# Patient Record
Sex: Male | Born: 1945 | Race: White | Hispanic: No | Marital: Married | Smoking: Never smoker
Health system: Southern US, Community
[De-identification: ages and names within clinical notes are randomized; demographics above are authoritative.]

## PROBLEM LIST (undated history)

## (undated) DIAGNOSIS — I517 Cardiomegaly: Secondary | ICD-10-CM

## (undated) DIAGNOSIS — K4090 Unilateral inguinal hernia, without obstruction or gangrene, not specified as recurrent: Secondary | ICD-10-CM

## (undated) DIAGNOSIS — N182 Chronic kidney disease, stage 2 (mild): Secondary | ICD-10-CM

## (undated) DIAGNOSIS — N2889 Other specified disorders of kidney and ureter: Secondary | ICD-10-CM

## (undated) DIAGNOSIS — K219 Gastro-esophageal reflux disease without esophagitis: Secondary | ICD-10-CM

## (undated) DIAGNOSIS — R251 Tremor, unspecified: Secondary | ICD-10-CM

## (undated) DIAGNOSIS — R002 Palpitations: Secondary | ICD-10-CM

## (undated) DIAGNOSIS — I358 Other nonrheumatic aortic valve disorders: Secondary | ICD-10-CM

## (undated) DIAGNOSIS — I499 Cardiac arrhythmia, unspecified: Secondary | ICD-10-CM

## (undated) DIAGNOSIS — M1990 Unspecified osteoarthritis, unspecified site: Secondary | ICD-10-CM

## (undated) DIAGNOSIS — I4891 Unspecified atrial fibrillation: Secondary | ICD-10-CM

## (undated) DIAGNOSIS — R931 Abnormal findings on diagnostic imaging of heart and coronary circulation: Secondary | ICD-10-CM

## (undated) DIAGNOSIS — I10 Essential (primary) hypertension: Secondary | ICD-10-CM

## (undated) DIAGNOSIS — Z8679 Personal history of other diseases of the circulatory system: Secondary | ICD-10-CM

## (undated) DIAGNOSIS — C4490 Unspecified malignant neoplasm of skin, unspecified: Secondary | ICD-10-CM

## (undated) DIAGNOSIS — R011 Cardiac murmur, unspecified: Secondary | ICD-10-CM

## (undated) DIAGNOSIS — I714 Abdominal aortic aneurysm, without rupture, unspecified: Secondary | ICD-10-CM

## (undated) DIAGNOSIS — E785 Hyperlipidemia, unspecified: Secondary | ICD-10-CM

## (undated) DIAGNOSIS — I739 Peripheral vascular disease, unspecified: Secondary | ICD-10-CM

## (undated) DIAGNOSIS — C801 Malignant (primary) neoplasm, unspecified: Secondary | ICD-10-CM

## (undated) DIAGNOSIS — N289 Disorder of kidney and ureter, unspecified: Secondary | ICD-10-CM

## (undated) DIAGNOSIS — Z87442 Personal history of urinary calculi: Secondary | ICD-10-CM

## (undated) DIAGNOSIS — D751 Secondary polycythemia: Secondary | ICD-10-CM

## (undated) DIAGNOSIS — G4733 Obstructive sleep apnea (adult) (pediatric): Secondary | ICD-10-CM

## (undated) DIAGNOSIS — I341 Nonrheumatic mitral (valve) prolapse: Secondary | ICD-10-CM

## (undated) DIAGNOSIS — I340 Nonrheumatic mitral (valve) insufficiency: Secondary | ICD-10-CM

## (undated) HISTORY — DX: Hyperlipidemia, unspecified: E78.5

## (undated) HISTORY — DX: Palpitations: R00.2

## (undated) HISTORY — PX: LUMBAR FUSION: SHX111

## (undated) HISTORY — DX: Other specified disorders of kidney and ureter: N28.89

## (undated) HISTORY — DX: Personal history of other diseases of the circulatory system: Z86.79

## (undated) HISTORY — DX: Cardiomegaly: I51.7

## (undated) HISTORY — DX: Unspecified malignant neoplasm of skin, unspecified: C44.90

## (undated) HISTORY — DX: Other nonrheumatic aortic valve disorders: I35.8

## (undated) HISTORY — PX: KNEE ARTHROSCOPY: SUR90

## (undated) HISTORY — DX: Chronic kidney disease, stage 2 (mild): N18.2

## (undated) HISTORY — DX: Nonrheumatic mitral (valve) insufficiency: I34.0

## (undated) HISTORY — DX: Cardiac arrhythmia, unspecified: I49.9

## (undated) HISTORY — DX: Peripheral vascular disease, unspecified: I73.9

## (undated) HISTORY — DX: Nonrheumatic mitral (valve) prolapse: I34.1

## (undated) HISTORY — DX: Abnormal findings on diagnostic imaging of heart and coronary circulation: R93.1

## (undated) HISTORY — DX: Unilateral inguinal hernia, without obstruction or gangrene, not specified as recurrent: K40.90

## (undated) HISTORY — DX: Abdominal aortic aneurysm, without rupture, unspecified: I71.40

## (undated) HISTORY — DX: Secondary polycythemia: D75.1

## (undated) HISTORY — DX: Tremor, unspecified: R25.1

## (undated) HISTORY — DX: Unspecified atrial fibrillation: I48.91

## (undated) HISTORY — PX: HERNIA REPAIR: SHX51

## (undated) HISTORY — PX: SHOULDER ARTHROSCOPY: SHX128

## (undated) HISTORY — PX: OTHER SURGICAL HISTORY: SHX169

## (undated) HISTORY — DX: Cardiac murmur, unspecified: R01.1

## (undated) HISTORY — DX: Obstructive sleep apnea (adult) (pediatric): G47.33

---

## 2016-08-24 DIAGNOSIS — M7670 Peroneal tendinitis, unspecified leg: Secondary | ICD-10-CM | POA: Insufficient documentation

## 2016-09-14 DIAGNOSIS — M79673 Pain in unspecified foot: Secondary | ICD-10-CM | POA: Insufficient documentation

## 2017-08-20 DIAGNOSIS — N182 Chronic kidney disease, stage 2 (mild): Secondary | ICD-10-CM | POA: Insufficient documentation

## 2017-08-20 DIAGNOSIS — K219 Gastro-esophageal reflux disease without esophagitis: Secondary | ICD-10-CM | POA: Insufficient documentation

## 2017-09-10 DIAGNOSIS — E7800 Pure hypercholesterolemia, unspecified: Secondary | ICD-10-CM | POA: Insufficient documentation

## 2017-09-10 DIAGNOSIS — D751 Secondary polycythemia: Secondary | ICD-10-CM | POA: Insufficient documentation

## 2017-09-10 DIAGNOSIS — H04123 Dry eye syndrome of bilateral lacrimal glands: Secondary | ICD-10-CM | POA: Insufficient documentation

## 2017-09-10 DIAGNOSIS — J309 Allergic rhinitis, unspecified: Secondary | ICD-10-CM | POA: Insufficient documentation

## 2019-09-11 ENCOUNTER — Other Ambulatory Visit: Payer: Self-pay

## 2019-09-11 DIAGNOSIS — Z20822 Contact with and (suspected) exposure to covid-19: Secondary | ICD-10-CM

## 2019-09-13 LAB — NOVEL CORONAVIRUS, NAA: SARS-CoV-2, NAA: NOT DETECTED

## 2019-10-02 DIAGNOSIS — M19019 Primary osteoarthritis, unspecified shoulder: Secondary | ICD-10-CM | POA: Insufficient documentation

## 2021-07-24 ENCOUNTER — Other Ambulatory Visit (HOSPITAL_BASED_OUTPATIENT_CLINIC_OR_DEPARTMENT_OTHER): Payer: Self-pay | Admitting: Family Medicine

## 2021-07-24 DIAGNOSIS — R109 Unspecified abdominal pain: Secondary | ICD-10-CM

## 2021-07-29 ENCOUNTER — Other Ambulatory Visit: Payer: Self-pay

## 2021-07-29 ENCOUNTER — Ambulatory Visit (HOSPITAL_BASED_OUTPATIENT_CLINIC_OR_DEPARTMENT_OTHER)
Admission: RE | Admit: 2021-07-29 | Discharge: 2021-07-29 | Disposition: A | Discharge status: Home / Self Care | Payer: Medicare Other | Source: Ambulatory Visit | Attending: Family Medicine | Admitting: Family Medicine

## 2021-07-29 DIAGNOSIS — R10A0 Flank pain, unspecified side: Secondary | ICD-10-CM

## 2021-07-29 DIAGNOSIS — R109 Unspecified abdominal pain: Secondary | ICD-10-CM | POA: Diagnosis present

## 2021-07-29 LAB — POCT I-STAT CREATININE: Creatinine, Ser: 1 mg/dL (ref 0.61–1.24)

## 2021-07-29 MED ORDER — IOHEXOL 300 MG/ML  SOLN
75.0000 mL | Freq: Once | INTRAMUSCULAR | Status: AC | PRN
  Administered 2021-07-29: 75 mL via INTRAVENOUS

## 2021-08-05 ENCOUNTER — Other Ambulatory Visit: Payer: Self-pay

## 2021-08-05 ENCOUNTER — Encounter (HOSPITAL_COMMUNITY): Payer: Self-pay | Admitting: Pharmacy Technician

## 2021-08-05 ENCOUNTER — Emergency Department (HOSPITAL_COMMUNITY): Payer: Medicare Other

## 2021-08-05 ENCOUNTER — Emergency Department (HOSPITAL_COMMUNITY)
Admission: EM | Admit: 2021-08-05 | Discharge: 2021-08-06 | Disposition: A | Discharge status: Home / Self Care | Payer: Medicare Other | Attending: Emergency Medicine | Admitting: Emergency Medicine

## 2021-08-05 ENCOUNTER — Ambulatory Visit (HOSPITAL_BASED_OUTPATIENT_CLINIC_OR_DEPARTMENT_OTHER): Payer: Medicare Other

## 2021-08-05 DIAGNOSIS — I10 Essential (primary) hypertension: Secondary | ICD-10-CM | POA: Diagnosis not present

## 2021-08-05 DIAGNOSIS — I639 Cerebral infarction, unspecified: Secondary | ICD-10-CM | POA: Diagnosis not present

## 2021-08-05 DIAGNOSIS — R29810 Facial weakness: Secondary | ICD-10-CM | POA: Insufficient documentation

## 2021-08-05 DIAGNOSIS — H538 Other visual disturbances: Secondary | ICD-10-CM

## 2021-08-05 DIAGNOSIS — Z859 Personal history of malignant neoplasm, unspecified: Secondary | ICD-10-CM | POA: Diagnosis not present

## 2021-08-05 DIAGNOSIS — H53141 Visual discomfort, right eye: Secondary | ICD-10-CM | POA: Diagnosis not present

## 2021-08-05 HISTORY — DX: Essential (primary) hypertension: I10

## 2021-08-05 HISTORY — DX: Malignant (primary) neoplasm, unspecified: C80.1

## 2021-08-05 HISTORY — DX: Disorder of kidney and ureter, unspecified: N28.9

## 2021-08-05 LAB — COMPREHENSIVE METABOLIC PANEL
ALT: 28 U/L (ref 0–44)
AST: 32 U/L (ref 15–41)
Albumin: 3.9 g/dL (ref 3.5–5.0)
Alkaline Phosphatase: 57 U/L (ref 38–126)
Anion gap: 10 (ref 5–15)
BUN: 20 mg/dL (ref 8–23)
CO2: 26 mmol/L (ref 22–32)
Calcium: 9.2 mg/dL (ref 8.9–10.3)
Chloride: 103 mmol/L (ref 98–111)
Creatinine, Ser: 0.99 mg/dL (ref 0.61–1.24)
GFR, Estimated: >60 mL/min (ref >60)
Glucose, Bld: 102 mg/dL — ABNORMAL HIGH (ref 70–99)
Potassium: 4 mmol/L (ref 3.5–5.1)
Sodium: 139 mmol/L (ref 135–145)
Total Bilirubin: 0.8 mg/dL (ref 0.3–1.2)
Total Protein: 6.5 g/dL (ref 6.5–8.1)

## 2021-08-05 LAB — URINALYSIS, ROUTINE W REFLEX MICROSCOPIC
Bacteria, UA: NONE SEEN
Bilirubin Urine: NEGATIVE
Glucose, UA: NEGATIVE mg/dL
Ketones, ur: NEGATIVE mg/dL
Leukocytes,Ua: NEGATIVE
Nitrite: NEGATIVE
Protein, ur: 100 mg/dL — AB
Specific Gravity, Urine: 1.019 (ref 1.005–1.030)
pH: 5 (ref 5.0–8.0)

## 2021-08-05 LAB — DIFFERENTIAL
Abs Immature Granulocytes: 0.02 10*3/uL (ref 0.00–0.07)
Basophils Absolute: 0.1 10*3/uL (ref 0.0–0.1)
Basophils Relative: 1 %
Eosinophils Absolute: 0.2 10*3/uL (ref 0.0–0.5)
Eosinophils Relative: 3 %
Immature Granulocytes: 0 %
Lymphocytes Relative: 32 %
Lymphs Abs: 2.6 10*3/uL (ref 0.7–4.0)
Monocytes Absolute: 0.7 10*3/uL (ref 0.1–1.0)
Monocytes Relative: 9 %
Neutro Abs: 4.4 10*3/uL (ref 1.7–7.7)
Neutrophils Relative %: 55 %

## 2021-08-05 LAB — PROTIME-INR
INR: 1.1 (ref 0.8–1.2)
Prothrombin Time: 14.2 seconds (ref 11.4–15.2)

## 2021-08-05 LAB — APTT: aPTT: 29 seconds (ref 24–36)

## 2021-08-05 LAB — CBC
HCT: 50.4 % (ref 39.0–52.0)
Hemoglobin: 16.6 g/dL (ref 13.0–17.0)
MCH: 31.2 pg (ref 26.0–34.0)
MCHC: 32.9 g/dL (ref 30.0–36.0)
MCV: 94.7 fL (ref 80.0–100.0)
Platelets: 187 10*3/uL (ref 150–400)
RBC: 5.32 MIL/uL (ref 4.22–5.81)
RDW: 12.3 % (ref 11.5–15.5)
WBC: 8 10*3/uL (ref 4.0–10.5)
nRBC: 0 % (ref 0.0–0.2)

## 2021-08-05 LAB — SEDIMENTATION RATE: Sed Rate: 1 mm/hr (ref 0–16)

## 2021-08-05 LAB — C-REACTIVE PROTEIN: CRP: 0.7 mg/dL (ref <1.0)

## 2021-08-05 NOTE — ED Triage Notes (Signed)
Pt here from eye doctor for neuro workup. Per pt, yesterday his R vision went out for 30 seconds and returned. Pt states similar episode earlier this week. No weakness noted. Pt with slight L facial droop.
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
## 2021-08-05 NOTE — ED Provider Notes (Addendum)
Emergency Medicine Provider Triage Evaluation Note  Gavin Hernandez , a 75 y.o. male  was evaluated in triage.  Pt complains of intermittent visual loss which has occurred twice over the last few days. Sxs are resolved currently. He was seen at Dr. Darleen Crocker office PTA and had a w/u which was unrevealing for papilledema. He was sent to the ED for r/o GCA and stroke. Recommended ESR, CBC, carotid doppler and echo.  Recently dx with probable renal cell carcinoma last week  Review of Systems  Positive: Vision loss Negative: Numbness, weakness, aphasia  Physical Exam  BP (!) 169/91 (BP Location: Left Arm)   Pulse 61   Temp 98.2 F (36.8 C) (Oral)   Resp 18   SpO2 99%  Gen:   Awake, no distress   Resp:  Normal effort  MSK:   Moves extremities without difficulty  Other:  Possible left facial droop? No pronator drift, normal finger to nose bilat. No aphasia or slurred speech. No obvious focal deficit  Medical Decision Making  Medically screening exam initiated at 4:31 PM.  Appropriate orders placed.  Gavin Hernandez was informed that the remainder of the evaluation will be completed by another provider, this initial triage assessment does not replace that evaluation, and the importance of remaining in the ED until their evaluation is complete.     Rodney Booze, PA-C 08/05/21 1633    Rodney Booze, PA-C 08/05/21 1640    Margette Fast, MD 08/05/21 2214
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
## 2021-08-05 NOTE — ED Notes (Signed)
Patient transported to CT 

## 2021-08-06 ENCOUNTER — Emergency Department (HOSPITAL_COMMUNITY): Payer: Medicare Other

## 2021-08-06 DIAGNOSIS — H53141 Visual discomfort, right eye: Secondary | ICD-10-CM | POA: Diagnosis not present

## 2021-08-06 MED ORDER — IOHEXOL 350 MG/ML SOLN
75.0000 mL | Freq: Once | INTRAVENOUS | Status: AC | PRN
  Administered 2021-08-06: 75 mL via INTRAVENOUS

## 2021-08-06 NOTE — ED Notes (Signed)
Patient verbalizes understanding of discharge instructions. Opportunity for questioning and answers were provided. Armband removed by staff, pt discharged from ED via wheelchair.  

## 2021-08-13 ENCOUNTER — Other Ambulatory Visit: Payer: Self-pay | Admitting: Urology

## 2021-08-13 DIAGNOSIS — D49511 Neoplasm of unspecified behavior of right kidney: Secondary | ICD-10-CM

## 2021-08-14 ENCOUNTER — Other Ambulatory Visit (HOSPITAL_COMMUNITY): Payer: Self-pay | Admitting: Urology

## 2021-08-14 ENCOUNTER — Other Ambulatory Visit: Payer: Self-pay

## 2021-08-14 ENCOUNTER — Ambulatory Visit (HOSPITAL_COMMUNITY)
Admission: RE | Admit: 2021-08-14 | Discharge: 2021-08-14 | Disposition: A | Discharge status: Home / Self Care | Payer: Medicare Other | Source: Ambulatory Visit | Attending: Urology | Admitting: Urology

## 2021-08-14 DIAGNOSIS — D49511 Neoplasm of unspecified behavior of right kidney: Secondary | ICD-10-CM | POA: Diagnosis present

## 2021-08-14 DIAGNOSIS — I82220 Acute embolism and thrombosis of inferior vena cava: Secondary | ICD-10-CM | POA: Insufficient documentation

## 2021-08-21 ENCOUNTER — Other Ambulatory Visit: Payer: Self-pay | Admitting: Urology

## 2021-08-22 NOTE — Progress Notes (Signed)
Sent message, via epic in basket, requesting orders in epic from surgeon.  

## 2021-08-25 ENCOUNTER — Other Ambulatory Visit (HOSPITAL_COMMUNITY): Payer: Self-pay

## 2021-08-26 ENCOUNTER — Other Ambulatory Visit: Payer: Self-pay | Admitting: Urology

## 2021-08-26 NOTE — Patient Instructions (Addendum)
DUE TO COVID-19 ONLY ONE VISITOR IS ALLOWED TO COME WITH YOU AND STAY IN THE WAITING ROOM ONLY DURING PRE OP AND PROCEDURE.   **NO VISITORS ARE ALLOWED IN THE SHORT STAY AREA OR RECOVERY ROOM!!**  IF YOU WILL BE ADMITTED INTO THE HOSPITAL YOU ARE ALLOWED ONLY TWO SUPPORT PEOPLE DURING VISITATION HOURS ONLY (10AM -8PM)   The support person(s) may change daily. The support person(s) must pass our screening, gel in and out, and wear a mask at all times, including in the patient's room. Patients must also wear a mask when staff or their support person are in the room.  No visitors under the age of 16. Any visitor under the age of 69 must be accompanied by an adult.        Your procedure is scheduled on: 08/29/21   Report to [facility] Main Entrance    Report to admitting at 10:15 AM   Call this number if you have problems the morning of surgery [phone]   Do not eat food :After Midnight.   May have liquids until 9:30 AM day of surgery  CLEAR LIQUID DIET  Foods Allowed                                                                     Foods Excluded  Water, Black Coffee and tea (no milk or creamer)            liquids that you cannot  Plain Jell-O in any flavor  (No red)                                    see through such as: Fruit ices (not with fruit pulp)                                            milk, soups, orange juice              Iced Popsicles (No red)                                                All solid food                                   Apple juices Sports drinks like Gatorade (No red) Lightly seasoned clear broth or consume(fat free) Sugar     Oral Hygiene is also important to reduce your risk of infection.                                    Remember - BRUSH YOUR TEETH THE MORNING OF SURGERY WITH YOUR REGULAR TOOTHPASTE   Take these medicines the morning of surgery with A SIP OF WATER: Omeprazole  DO NOT TAKE ANY ORAL DIABETIC MEDICATIONS DAY OF  YOUR SURGERY                              You may not have any metal on your body including jewelry, and body piercing             Do not wear lotions, powders, cologne, or deodorant              Men may shave face and neck.   Do not bring valuables to the hospital. [place].   Bring small overnight bag day of surgery.  Special Instructions: Bring a copy of your healthcare power of attorney and living will documents         the day of surgery if you haven't scanned them before.  Please read over the following fact sheets you were given: IF YOU HAVE QUESTIONS ABOUT YOUR PRE-OP INSTRUCTIONS PLEASE CALL [facility] - Preparing for Surgery Before surgery, you can play an important role.  Because skin is not sterile, your skin needs to be as free of germs as possible.  You can reduce the number of germs on your skin by washing with CHG (chlorahexidine gluconate) soap before surgery.  CHG is an antiseptic cleaner which kills germs and bonds with the skin to continue killing germs even after washing. Please DO NOT use if you have an allergy to CHG or antibacterial soaps.  If your skin becomes reddened/irritated stop using the CHG and inform your nurse when you arrive at Short Stay. Do not shave (including legs and underarms) for at least 48 hours prior to the first CHG shower.  You may shave your face/neck.  Please follow these instructions carefully:  1.  Shower with CHG Soap the night before surgery and the  morning of surgery.  2.  If you choose to wash your hair, wash your hair first as usual with your normal  shampoo.  3.  After you shampoo, rinse your hair and body thoroughly to remove the shampoo.                             4.  Use CHG as you would any other liquid soap.  You can apply chg directly to the skin and wash.  Gently with a scrungie or clean washcloth.  5.  Apply the CHG Soap to your body ONLY FROM THE  NECK DOWN.   Do   not use on face/ open                           Wound or open sores. Avoid contact with eyes, ears mouth and   genitals (private parts).                       Wash face,  Genitals (private parts) with your normal soap.             6.  Wash thoroughly, paying special attention to the area where your    surgery  will be performed.  7.  Thoroughly rinse your body with warm water from the neck down.  8.  DO NOT shower/wash with your normal soap after using and rinsing off the CHG Soap.  9.  Pat yourself dry with a clean towel.            10.  Wear clean pajamas.            11.  Place clean sheets on your bed the night of your first shower and do not  sleep with pets. Day of Surgery : Do not apply any lotions/deodorants the morning of surgery.  Please wear clean clothes to the hospital/surgery center.  FAILURE TO FOLLOW THESE INSTRUCTIONS MAY RESULT IN THE CANCELLATION OF YOUR SURGERY  PATIENT SIGNATURE_________________________________  NURSE SIGNATURE__________________________________  ________________________________________________________________________   Gavin Hernandez  An incentive spirometer is a tool that can help keep your lungs clear and active. This tool measures how well you are filling your lungs with each breath. Taking long deep breaths may help reverse or decrease the chance of developing breathing (pulmonary) problems (especially infection) following: A long period of time when you are unable to move or be active. BEFORE THE PROCEDURE  If the spirometer includes an indicator to show your best effort, your nurse or respiratory therapist will set it to a desired goal. If possible, sit up straight or lean slightly forward. Try not to slouch. Hold the incentive spirometer in an upright position. INSTRUCTIONS FOR USE  Sit on the edge of your bed if possible, or sit up as far as you can in bed or on a chair. Hold the incentive spirometer in  an upright position. Breathe out normally. Place the mouthpiece in your mouth and seal your lips tightly around it. Breathe in slowly and as deeply as possible, raising the piston or the ball toward the top of the column. Hold your breath for 3-5 seconds or for as long as possible. Allow the piston or ball to fall to the bottom of the column. Remove the mouthpiece from your mouth and breathe out normally. Rest for a few seconds and repeat Steps 1 through 7 at least 10 times every 1-2 hours when you are awake. Take your time and take a few normal breaths between deep breaths. The spirometer may include an indicator to show your best effort. Use the indicator as a goal to work toward during each repetition. After each set of 10 deep breaths, practice coughing to be sure your lungs are clear. If you have an incision (the cut made at the time of surgery), support your incision when coughing by placing a pillow or rolled up towels firmly against it. Once you are able to get out of bed, walk around indoors and cough well. You may stop using the incentive spirometer when instructed by your caregiver.  RISKS AND COMPLICATIONS Take your time so you do not get dizzy or light-headed. If you are in pain, you may need to take or ask for pain medication before doing incentive spirometry. It is harder to take a deep breath if you are having pain. AFTER USE Rest and breathe slowly and easily. It can be helpful to keep track of a log of your progress. Your caregiver can provide you with a simple table to help with this. If you are using the spirometer at home, follow these instructions: [place] IF:  You are having difficultly using the spirometer. You have trouble using the spirometer as often as instructed. Your pain medication is not giving enough relief while using the spirometer. You develop fever of 100.5 F (38.1 C) or higher. SEEK IMMEDIATE MEDICAL CARE IF:  You cough up bloody sputum that  had not been present before. You develop fever of 102 F (38.9 C) or greater. You develop worsening pain at or near the incision site. MAKE SURE YOU:  Understand these instructions. Will watch your condition. Will get help right away if you are not doing well or get worse. Document Released: 02/22/2007 Document Revised: 01/04/2012 Document Reviewed: 04/25/2007 Exit[employer] Patient Information 2014 [employer], [place].   ________________________________________________________________________    WHAT IS A BLOOD TRANSFUSION? Blood Transfusion Information  A transfusion is the replacement of blood or some of its parts. Blood is made up of multiple cells which provide different functions. Red blood cells carry oxygen and are used for blood loss replacement. White blood cells fight against infection. Platelets control bleeding. Plasma helps clot blood. Other blood products are available for specialized needs, such as hemophilia or other clotting disorders. BEFORE THE TRANSFUSION  Who gives blood for transfusions?  Healthy volunteers who are fully evaluated to make sure their blood is safe. This is blood bank blood. Transfusion therapy is the safest it has ever been in the practice of medicine. Before blood is taken from a donor, a complete history is taken to make sure that person has no history of diseases nor engages in risky social behavior (examples are intravenous drug use or sexual activity with multiple partners). The donor's travel history is screened to minimize risk of transmitting infections, such as malaria. The donated blood is tested for signs of infectious diseases, such as HIV and hepatitis. The blood is then tested to be sure it is compatible with you in order to minimize the chance of a transfusion reaction. If you or a relative donates blood, this is often done in anticipation of surgery and is not appropriate for emergency situations. It takes many days to process the donated  blood. RISKS AND COMPLICATIONS Although transfusion therapy is very safe and saves many lives, the main dangers of transfusion include:  Getting an infectious disease. Developing a transfusion reaction. This is an allergic reaction to something in the blood you were given. Every precaution is taken to prevent this. The decision to have a blood transfusion has been considered carefully by your caregiver before blood is given. Blood is not given unless the benefits outweigh the risks. AFTER THE TRANSFUSION Right after receiving a blood transfusion, you will usually feel much better and more energetic. This is especially true if your red blood cells have gotten low (anemic). The transfusion raises the level of the red blood cells which carry oxygen, and this usually causes an energy increase. The nurse administering the transfusion will monitor you carefully for complications. HOME CARE INSTRUCTIONS  No special instructions are needed after a transfusion. You may find your energy is better. Speak with your caregiver about any limitations on activity for underlying diseases you may have. SEEK MEDICAL CARE IF:  Your condition is not improving after your transfusion. You develop redness or irritation at the intravenous (IV) site. SEEK IMMEDIATE MEDICAL CARE IF:  Any of the following symptoms occur over the next 12 hours: Shaking chills. You have a temperature by mouth above 102 F (38.9 C), not controlled by medicine. Chest, back, or muscle pain. People around you feel you are not acting correctly or are confused. Shortness of breath or difficulty breathing. Dizziness and fainting. You get a rash or develop hives. You have a decrease in urine output. Your urine turns a dark color or changes to pink, red, or brown. Any of the following symptoms occur over the next  10 days: You have a temperature by mouth above 102 F (38.9 C), not controlled by medicine. Shortness of breath. Weakness after  normal activity. The white part of the eye turns yellow (jaundice). You have a decrease in the amount of urine or are urinating less often. Your urine turns a dark color or changes to pink, red, or brown. Document Released: 10/09/2000 Document Revised: 01/04/2012 Document Reviewed: 05/28/2008 [facility] Patient Information 2014 [place], [place].  _______________________________________________________________________

## 2021-08-26 NOTE — Progress Notes (Addendum)
COVID swab appointment: 08/26/21  COVID Vaccine Completed: yes x5 Date COVID Vaccine completed: Has received booster: COVID vaccine manufacturer:  Moderna     Date of COVID positive in last 90 days: no  PCP - Sela Hilding, MD Cardiologist - Dr. Dorris Carnes, MD hasn't seen yet  Chest x-ray - 08/14/21 Epic EKG - 08/06/21 Epic Stress Test - many years per pt ECHO - n/a Cardiac Cath - n/a Pacemaker/ICD device last checked: n/a Spinal Cord Stimulator: n/a  Sleep Study - yes positive, had deviated septum fixed CPAP - no longer uses  Fasting Blood Sugar - n/a Checks Blood Sugar _____ times a day  Blood Thinner Instructions: n/a Aspirin Instructions: Last Dose:  Activity level: Can go up a flight of stairs and perform activities of daily living without stopping and without symptoms of chest pain or shortness of breath.   Anesthesia review: HTN, needs clearance?  Patient denies shortness of breath, fever, cough and chest pain at PAT appointment   Patient verbalized understanding of instructions that were given to them at the PAT appointment. Patient was also instructed that they will need to review over the PAT instructions again at home before surgery.

## 2021-08-27 ENCOUNTER — Ambulatory Visit
Admission: RE | Admit: 2021-08-27 | Discharge: 2021-08-27 | Disposition: A | Discharge status: Home / Self Care | Payer: Medicare Other | Source: Ambulatory Visit | Attending: Urology | Admitting: Urology

## 2021-08-27 ENCOUNTER — Encounter (HOSPITAL_COMMUNITY): Payer: Self-pay

## 2021-08-27 ENCOUNTER — Encounter (HOSPITAL_COMMUNITY)
Admission: RE | Admit: 2021-08-27 | Discharge: 2021-08-27 | Disposition: A | Discharge status: Home / Self Care | Payer: Medicare Other | Source: Ambulatory Visit | Attending: Urology | Admitting: Urology

## 2021-08-27 ENCOUNTER — Other Ambulatory Visit: Payer: Self-pay

## 2021-08-27 DIAGNOSIS — Z79899 Other long term (current) drug therapy: Secondary | ICD-10-CM | POA: Insufficient documentation

## 2021-08-27 DIAGNOSIS — Z87442 Personal history of urinary calculi: Secondary | ICD-10-CM | POA: Insufficient documentation

## 2021-08-27 DIAGNOSIS — K219 Gastro-esophageal reflux disease without esophagitis: Secondary | ICD-10-CM | POA: Insufficient documentation

## 2021-08-27 DIAGNOSIS — I10 Essential (primary) hypertension: Secondary | ICD-10-CM | POA: Insufficient documentation

## 2021-08-27 DIAGNOSIS — Z01812 Encounter for preprocedural laboratory examination: Secondary | ICD-10-CM | POA: Insufficient documentation

## 2021-08-27 DIAGNOSIS — D49511 Neoplasm of unspecified behavior of right kidney: Secondary | ICD-10-CM

## 2021-08-27 DIAGNOSIS — N2889 Other specified disorders of kidney and ureter: Secondary | ICD-10-CM | POA: Insufficient documentation

## 2021-08-27 HISTORY — DX: Gastro-esophageal reflux disease without esophagitis: K21.9

## 2021-08-27 HISTORY — DX: Unspecified osteoarthritis, unspecified site: M19.90

## 2021-08-27 HISTORY — DX: Personal history of urinary calculi: Z87.442

## 2021-08-27 LAB — SARS CORONAVIRUS 2 (TAT 6-24 HRS): SARS Coronavirus 2: NEGATIVE

## 2021-08-27 MED ORDER — GADOBENATE DIMEGLUMINE 529 MG/ML IV SOLN
17.0000 mL | Freq: Once | INTRAVENOUS | Status: AC | PRN
  Administered 2021-08-27: 17 mL via INTRAVENOUS

## 2021-08-28 ENCOUNTER — Encounter (HOSPITAL_COMMUNITY): Payer: Self-pay

## 2021-08-28 NOTE — Progress Notes (Signed)
Anesthesia Chart Review   Case: [id number] Date/Time: 08/29/21 1215   Procedure: XI ROBOTIC ASSISTED LAPAROSCOPIC RADICAL NEPHRECTOMY (Right) - ONLY NEEDS 180 MIN   Anesthesia type: General   Pre-op diagnosis: RIGHT RENAL MASS   Location: [facility] ROOM 03 / [facility]   Surgeons: Ceasar Mons, MD       DISCUSSION:75 y.o. never smoker with h/o GERD, HTN, right renal mass scheduled for above procedure 08/29/2021 with Dr. Nadeen Landau.   No cardiac history.  Reports he can go up a flight of stairs without difficulty.    Anticipate pt can proceed with planned procedure barring acute status change.   VS: BP (!) 155/81   Pulse (!) 58   Temp 37.2 C (Oral)   Resp 16   Ht 5\' 9"  (1.753 m)   Wt 81.6 kg   SpO2 98%   BMI 26.58 kg/m   PROVIDERS: Glenis Smoker, MD is PCP    LABS: Labs reviewed: Acceptable for surgery. (all labs ordered are listed, but only abnormal results are displayed)  Labs Reviewed  TYPE AND SCREEN     IMAGES:   EKG: 08/06/2021 Rate 54 bpm  Sinus bradycardia   CV:  Past Medical History:  Diagnosis Date   Arthritis    Cancer ([place])    GERD (gastroesophageal reflux disease)    History of kidney stones    Hypertension    Renal disorder     Past Surgical History:  Procedure Laterality Date   KNEE ARTHROSCOPY Left    x2   LUMBAR FUSION     SHOULDER ARTHROSCOPY     thumb surgery      MEDICATIONS:  atorvastatin (LIPITOR) 20 MG tablet   Coenzyme Q10 (COQ10) 200 MG CAPS   diphenhydramine-acetaminophen (TYLENOL PM) 25-500 MG TABS tablet   famotidine (PEPCID) 40 MG tablet   Glucosamine-Chondroitin (OSTEO BI-FLEX REGULAR STRENGTH PO)   losartan (COZAAR) 25 MG tablet   Multiple Vitamin (MULTIVITAMIN WITH MINERALS) TABS tablet   omeprazole (PRILOSEC) 40 MG capsule   OVER THE COUNTER MEDICATION   RESTASIS 0.05 % ophthalmic emulsion   Saw Palmetto 450 MG CAPS   No current facility-administered medications for this encounter.     Konrad Felix, PA-C [facility] Pre-Surgical Testing [phone]

## 2021-08-29 ENCOUNTER — Other Ambulatory Visit (HOSPITAL_COMMUNITY): Payer: Self-pay

## 2021-08-29 ENCOUNTER — Inpatient Hospital Stay (HOSPITAL_COMMUNITY): Payer: Medicare Other | Admitting: Physician Assistant

## 2021-08-29 ENCOUNTER — Encounter (HOSPITAL_COMMUNITY)
Admission: RE | Disposition: A | Discharge status: Home / Self Care | Payer: Self-pay | Source: Home / Self Care | Attending: Urology

## 2021-08-29 ENCOUNTER — Encounter (HOSPITAL_COMMUNITY): Payer: Self-pay | Admitting: Urology

## 2021-08-29 ENCOUNTER — Inpatient Hospital Stay (HOSPITAL_COMMUNITY)
Admission: RE | Admit: 2021-08-29 | Discharge: 2021-08-30 | DRG: 658 | Disposition: A | Discharge status: Home / Self Care | Payer: Medicare Other | Attending: Urology | Admitting: Urology

## 2021-08-29 ENCOUNTER — Inpatient Hospital Stay (HOSPITAL_COMMUNITY): Payer: Medicare Other | Admitting: Anesthesiology

## 2021-08-29 ENCOUNTER — Other Ambulatory Visit: Payer: Self-pay

## 2021-08-29 DIAGNOSIS — I10 Essential (primary) hypertension: Secondary | ICD-10-CM | POA: Diagnosis present

## 2021-08-29 DIAGNOSIS — Z981 Arthrodesis status: Secondary | ICD-10-CM

## 2021-08-29 DIAGNOSIS — Z79899 Other long term (current) drug therapy: Secondary | ICD-10-CM | POA: Diagnosis not present

## 2021-08-29 DIAGNOSIS — Z20822 Contact with and (suspected) exposure to covid-19: Secondary | ICD-10-CM | POA: Diagnosis present

## 2021-08-29 DIAGNOSIS — N281 Cyst of kidney, acquired: Secondary | ICD-10-CM | POA: Diagnosis present

## 2021-08-29 DIAGNOSIS — Z01812 Encounter for preprocedural laboratory examination: Secondary | ICD-10-CM

## 2021-08-29 DIAGNOSIS — N2889 Other specified disorders of kidney and ureter: Secondary | ICD-10-CM | POA: Diagnosis present

## 2021-08-29 DIAGNOSIS — E739 Lactose intolerance, unspecified: Secondary | ICD-10-CM | POA: Diagnosis present

## 2021-08-29 DIAGNOSIS — M1990 Unspecified osteoarthritis, unspecified site: Secondary | ICD-10-CM | POA: Diagnosis present

## 2021-08-29 DIAGNOSIS — C641 Malignant neoplasm of right kidney, except renal pelvis: Principal | ICD-10-CM | POA: Diagnosis present

## 2021-08-29 DIAGNOSIS — Z881 Allergy status to other antibiotic agents status: Secondary | ICD-10-CM

## 2021-08-29 DIAGNOSIS — N200 Calculus of kidney: Secondary | ICD-10-CM | POA: Diagnosis present

## 2021-08-29 DIAGNOSIS — K219 Gastro-esophageal reflux disease without esophagitis: Secondary | ICD-10-CM | POA: Diagnosis present

## 2021-08-29 DIAGNOSIS — Z87442 Personal history of urinary calculi: Secondary | ICD-10-CM

## 2021-08-29 HISTORY — PX: ROBOT ASSISTED LAPAROSCOPIC NEPHRECTOMY: SHX5140

## 2021-08-29 LAB — ABO/RH: ABO/RH(D): A POS

## 2021-08-29 LAB — BASIC METABOLIC PANEL
Anion gap: 8 (ref 5–15)
BUN: 16 mg/dL (ref 8–23)
CO2: 25 mmol/L (ref 22–32)
Calcium: 9.2 mg/dL (ref 8.9–10.3)
Chloride: 106 mmol/L (ref 98–111)
Creatinine, Ser: 0.88 mg/dL (ref 0.61–1.24)
GFR, Estimated: >60 mL/min (ref >60)
Glucose, Bld: 89 mg/dL (ref 70–99)
Potassium: 3.8 mmol/L (ref 3.5–5.1)
Sodium: 139 mmol/L (ref 135–145)

## 2021-08-29 LAB — TYPE AND SCREEN
ABO/RH(D): A POS
Antibody Screen: NEGATIVE

## 2021-08-29 LAB — HEMOGLOBIN AND HEMATOCRIT, BLOOD
HCT: 46.8 % (ref 39.0–52.0)
Hemoglobin: 15.9 g/dL (ref 13.0–17.0)

## 2021-08-29 LAB — CBC
HCT: 48.4 % (ref 39.0–52.0)
Hemoglobin: 16.6 g/dL (ref 13.0–17.0)
MCH: 31.6 pg (ref 26.0–34.0)
MCHC: 34.3 g/dL (ref 30.0–36.0)
MCV: 92 fL (ref 80.0–100.0)
Platelets: 156 10*3/uL (ref 150–400)
RBC: 5.26 MIL/uL (ref 4.22–5.81)
RDW: 12.5 % (ref 11.5–15.5)
WBC: 5.5 10*3/uL (ref 4.0–10.5)
nRBC: 0 % (ref 0.0–0.2)

## 2021-08-29 SURGERY — NEPHRECTOMY, RADICAL, ROBOT-ASSISTED, LAPAROSCOPIC, ADULT
Anesthesia: General | Laterality: Right

## 2021-08-29 MED ORDER — AMISULPRIDE (ANTIEMETIC) 5 MG/2ML IV SOLN: 10.0000 mg | Freq: Once | INTRAVENOUS | Status: DC | PRN

## 2021-08-29 MED ORDER — BELLADONNA ALKALOIDS-OPIUM 16.2-60 MG RE SUPP: 1.0000 | Freq: Four times a day (QID) | RECTAL | Status: DC | PRN

## 2021-08-29 MED ORDER — PANTOPRAZOLE SODIUM 40 MG PO TBEC: 80.0000 mg | DELAYED_RELEASE_TABLET | Freq: Every day | ORAL | Status: DC

## 2021-08-29 MED ORDER — ACETAMINOPHEN 325 MG PO TABS: 325.0000 mg | ORAL_TABLET | Freq: Once | ORAL | Status: DC | PRN

## 2021-08-29 MED ORDER — DEXTROSE-NACL 5-0.45 % IV SOLN: INTRAVENOUS | Status: DC

## 2021-08-29 MED ORDER — PHENYLEPHRINE HCL-NACL 20-0.9 MG/250ML-% IV SOLN
INTRAVENOUS | Status: AC
  Filled 2021-08-29: qty 250

## 2021-08-29 MED ORDER — BUPIVACAINE LIPOSOME 1.3 % IJ SUSP
INTRAMUSCULAR | Status: AC
  Filled 2021-08-29: qty 20

## 2021-08-29 MED ORDER — CYCLOSPORINE 0.05 % OP EMUL
1.0000 [drp] | Freq: Two times a day (BID) | OPHTHALMIC | Status: DC
  Administered 2021-08-30 (×2): 1 [drp] via OPHTHALMIC
  Filled 2021-08-29 (×4): qty 30

## 2021-08-29 MED ORDER — FENTANYL CITRATE (PF) 100 MCG/2ML IJ SOLN
INTRAMUSCULAR | Status: DC | PRN
  Administered 2021-08-29 (×2): 50 ug via INTRAVENOUS
  Administered 2021-08-29 (×2): 25 ug via INTRAVENOUS
  Administered 2021-08-29: 50 ug via INTRAVENOUS
  Administered 2021-08-29: 25 ug via INTRAVENOUS
  Administered 2021-08-29 (×2): 50 ug via INTRAVENOUS
  Administered 2021-08-29: 25 ug via INTRAVENOUS

## 2021-08-29 MED ORDER — ONDANSETRON HCL 4 MG/2ML IJ SOLN
INTRAMUSCULAR | Status: AC
  Filled 2021-08-29: qty 2

## 2021-08-29 MED ORDER — SODIUM CHLORIDE (PF) 0.9 % IJ SOLN
INTRAMUSCULAR | Status: DC | PRN
  Administered 2021-08-29: 20 mL

## 2021-08-29 MED ORDER — 0.9 % SODIUM CHLORIDE (POUR BTL) OPTIME
TOPICAL | Status: DC | PRN
  Administered 2021-08-29: 1000 mL

## 2021-08-29 MED ORDER — DIPHENHYDRAMINE HCL 12.5 MG/5ML PO ELIX: 12.5000 mg | ORAL_SOLUTION | Freq: Four times a day (QID) | ORAL | Status: DC | PRN

## 2021-08-29 MED ORDER — LIDOCAINE HCL (CARDIAC) PF 100 MG/5ML IV SOSY
PREFILLED_SYRINGE | INTRAVENOUS | Status: DC | PRN
  Administered 2021-08-29: 50 mg via INTRAVENOUS

## 2021-08-29 MED ORDER — HYDROMORPHONE HCL 1 MG/ML IJ SOLN
0.5000 mg | INTRAMUSCULAR | Status: DC | PRN
Start: 2021-08-29 — End: 2021-08-30

## 2021-08-29 MED ORDER — MEPERIDINE HCL 50 MG/ML IJ SOLN: 6.2500 mg | INTRAMUSCULAR | Status: DC | PRN

## 2021-08-29 MED ORDER — LACTATED RINGERS IV SOLN: INTRAVENOUS | Status: DC

## 2021-08-29 MED ORDER — LACTATED RINGERS IV SOLN: INTRAVENOUS | Status: DC | PRN

## 2021-08-29 MED ORDER — FENTANYL CITRATE (PF) 250 MCG/5ML IJ SOLN
INTRAMUSCULAR | Status: AC
  Filled 2021-08-29: qty 5

## 2021-08-29 MED ORDER — DEXAMETHASONE SODIUM PHOSPHATE 10 MG/ML IJ SOLN
INTRAMUSCULAR | Status: DC | PRN
  Administered 2021-08-29: 8 mg via INTRAVENOUS

## 2021-08-29 MED ORDER — DEXMEDETOMIDINE (PRECEDEX) IN NS 20 MCG/5ML (4 MCG/ML) IV SYRINGE
PREFILLED_SYRINGE | INTRAVENOUS | Status: DC | PRN
  Administered 2021-08-29: 4 ug via INTRAVENOUS
  Administered 2021-08-29: 8 ug via INTRAVENOUS
  Administered 2021-08-29: 4 ug via INTRAVENOUS

## 2021-08-29 MED ORDER — FENTANYL CITRATE (PF) 100 MCG/2ML IJ SOLN
INTRAMUSCULAR | Status: AC
  Filled 2021-08-29: qty 2

## 2021-08-29 MED ORDER — DOCUSATE SODIUM 100 MG PO CAPS
100.0000 mg | ORAL_CAPSULE | Freq: Two times a day (BID) | ORAL | Status: DC
  Administered 2021-08-30 (×2): 100 mg via ORAL
  Filled 2021-08-29 (×2): qty 1

## 2021-08-29 MED ORDER — ROCURONIUM BROMIDE 10 MG/ML (PF) SYRINGE
PREFILLED_SYRINGE | INTRAVENOUS | Status: AC
  Filled 2021-08-29: qty 10

## 2021-08-29 MED ORDER — LOSARTAN POTASSIUM 25 MG PO TABS
25.0000 mg | ORAL_TABLET | Freq: Every day | ORAL | Status: DC
  Administered 2021-08-30: 25 mg via ORAL
  Filled 2021-08-29: qty 1

## 2021-08-29 MED ORDER — ACETAMINOPHEN 160 MG/5ML PO SOLN: 325.0000 mg | Freq: Once | ORAL | Status: DC | PRN

## 2021-08-29 MED ORDER — PROMETHAZINE HCL 25 MG/ML IJ SOLN: 6.2500 mg | INTRAMUSCULAR | Status: DC | PRN

## 2021-08-29 MED ORDER — ORAL CARE MOUTH RINSE: 15.0000 mL | Freq: Once | OROMUCOSAL | Status: AC

## 2021-08-29 MED ORDER — BUPIVACAINE LIPOSOME 1.3 % IJ SUSP
INTRAMUSCULAR | Status: DC | PRN
  Administered 2021-08-29: 20 mL

## 2021-08-29 MED ORDER — PROPOFOL 10 MG/ML IV BOLUS
INTRAVENOUS | Status: DC | PRN
  Administered 2021-08-29: 40 mg via INTRAVENOUS

## 2021-08-29 MED ORDER — ONDANSETRON HCL 4 MG/2ML IJ SOLN
INTRAMUSCULAR | Status: DC | PRN
  Administered 2021-08-29: 4 mg via INTRAVENOUS

## 2021-08-29 MED ORDER — MIDAZOLAM HCL 2 MG/2ML IJ SOLN
INTRAMUSCULAR | Status: AC
  Filled 2021-08-29: qty 2

## 2021-08-29 MED ORDER — ACETAMINOPHEN 10 MG/ML IV SOLN
1000.0000 mg | Freq: Four times a day (QID) | INTRAVENOUS | Status: AC
  Administered 2021-08-29 – 2021-08-30 (×4): 1000 mg via INTRAVENOUS
  Filled 2021-08-29 (×2): qty 100

## 2021-08-29 MED ORDER — LACTATED RINGERS IR SOLN
Status: DC | PRN
  Administered 2021-08-29: 1000 mL

## 2021-08-29 MED ORDER — FAMOTIDINE 20 MG PO TABS
40.0000 mg | ORAL_TABLET | Freq: Every evening | ORAL | Status: DC
  Administered 2021-08-29: 40 mg via ORAL
  Filled 2021-08-29: qty 2

## 2021-08-29 MED ORDER — ACETAMINOPHEN 10 MG/ML IV SOLN: 1000.0000 mg | Freq: Once | INTRAVENOUS | Status: DC | PRN

## 2021-08-29 MED ORDER — HYDROMORPHONE HCL 1 MG/ML IJ SOLN
0.2500 mg | INTRAMUSCULAR | Status: DC | PRN
  Administered 2021-08-29 (×2): 0.25 mg via INTRAVENOUS

## 2021-08-29 MED ORDER — BACITRACIN-NEOMYCIN-POLYMYXIN 400-5-5000 EX OINT: 1.0000 "application " | TOPICAL_OINTMENT | Freq: Three times a day (TID) | CUTANEOUS | Status: DC | PRN

## 2021-08-29 MED ORDER — OXYCODONE HCL 5 MG PO TABS: 5.0000 mg | ORAL_TABLET | ORAL | Status: DC | PRN

## 2021-08-29 MED ORDER — HYDROMORPHONE HCL 1 MG/ML IJ SOLN
INTRAMUSCULAR | Status: AC
  Administered 2021-08-29: 0.5 mg via INTRAVENOUS
  Filled 2021-08-29: qty 1

## 2021-08-29 MED ORDER — ONDANSETRON HCL 4 MG/2ML IJ SOLN
4.0000 mg | INTRAMUSCULAR | Status: DC | PRN
  Administered 2021-08-29 – 2021-08-30 (×2): 4 mg via INTRAVENOUS
  Filled 2021-08-29 (×2): qty 2

## 2021-08-29 MED ORDER — CHLORHEXIDINE GLUCONATE 0.12 % MT SOLN
15.0000 mL | Freq: Once | OROMUCOSAL | Status: AC
  Administered 2021-08-29: 15 mL via OROMUCOSAL

## 2021-08-29 MED ORDER — CEFAZOLIN SODIUM-DEXTROSE 2-4 GM/100ML-% IV SOLN
2.0000 g | Freq: Once | INTRAVENOUS | Status: AC
  Administered 2021-08-29: 2 g via INTRAVENOUS
  Filled 2021-08-29: qty 100

## 2021-08-29 MED ORDER — ATORVASTATIN CALCIUM 20 MG PO TABS
20.0000 mg | ORAL_TABLET | Freq: Every evening | ORAL | Status: DC
  Administered 2021-08-29: 20 mg via ORAL
  Filled 2021-08-29: qty 1

## 2021-08-29 MED ORDER — SODIUM CHLORIDE (PF) 0.9 % IJ SOLN
INTRAMUSCULAR | Status: AC
  Filled 2021-08-29: qty 20

## 2021-08-29 MED ORDER — PROMETHAZINE HCL 12.5 MG PO TABS
12.5000 mg | ORAL_TABLET | ORAL | 0 refills | Status: DC | PRN
  Filled 2021-08-29: qty 15, 3d supply, fill #0

## 2021-08-29 MED ORDER — HYDRALAZINE HCL 20 MG/ML IJ SOLN
INTRAMUSCULAR | Status: DC | PRN
  Administered 2021-08-29 (×2): 2.5 mg via INTRAVENOUS

## 2021-08-29 MED ORDER — HYDRALAZINE HCL 20 MG/ML IJ SOLN
INTRAMUSCULAR | Status: AC
  Filled 2021-08-29: qty 1

## 2021-08-29 MED ORDER — SUGAMMADEX SODIUM 200 MG/2ML IV SOLN
INTRAVENOUS | Status: DC | PRN
  Administered 2021-08-29: 100 mg via INTRAVENOUS
  Administered 2021-08-29: 200 mg via INTRAVENOUS

## 2021-08-29 MED ORDER — ROCURONIUM BROMIDE 100 MG/10ML IV SOLN
INTRAVENOUS | Status: DC | PRN
  Administered 2021-08-29: 20 mg via INTRAVENOUS
  Administered 2021-08-29: 60 mg via INTRAVENOUS
  Administered 2021-08-29: 20 mg via INTRAVENOUS

## 2021-08-29 MED ORDER — GLYCOPYRROLATE 0.2 MG/ML IJ SOLN
INTRAMUSCULAR | Status: DC | PRN
  Administered 2021-08-29: .2 mg via INTRAVENOUS

## 2021-08-29 MED ORDER — DIPHENHYDRAMINE HCL 50 MG/ML IJ SOLN: 12.5000 mg | Freq: Four times a day (QID) | INTRAMUSCULAR | Status: DC | PRN

## 2021-08-29 MED ORDER — PROPOFOL 10 MG/ML IV BOLUS
INTRAVENOUS | Status: AC
  Filled 2021-08-29: qty 20

## 2021-08-29 MED ORDER — DEXAMETHASONE SODIUM PHOSPHATE 10 MG/ML IJ SOLN
INTRAMUSCULAR | Status: AC
  Filled 2021-08-29: qty 1

## 2021-08-29 MED ORDER — DOCUSATE SODIUM 100 MG PO CAPS: 100.0000 mg | ORAL_CAPSULE | Freq: Two times a day (BID) | ORAL | Status: DC

## 2021-08-29 MED ORDER — HYDROCODONE-ACETAMINOPHEN 5-325 MG PO TABS
1.0000 | ORAL_TABLET | Freq: Four times a day (QID) | ORAL | 0 refills | Status: DC | PRN
  Filled 2021-08-29: qty 20, 3d supply, fill #0

## 2021-08-29 MED ORDER — MIDAZOLAM HCL 5 MG/5ML IJ SOLN
INTRAMUSCULAR | Status: DC | PRN
  Administered 2021-08-29 (×2): 1 mg via INTRAVENOUS

## 2021-08-29 MED ORDER — CEFAZOLIN SODIUM-DEXTROSE 1-4 GM/50ML-% IV SOLN
1.0000 g | Freq: Three times a day (TID) | INTRAVENOUS | Status: AC
  Administered 2021-08-30 (×2): 1 g via INTRAVENOUS
  Filled 2021-08-29 (×2): qty 50

## 2021-08-29 SURGICAL SUPPLY — 52 items
BAG COUNTER SPONGE SURGICOUNT (BAG) IMPLANT
BAG LAPAROSCOPIC 12 15 PORT 16 (BASKET) ×1 IMPLANT
BAG RETRIEVAL 12/15 (BASKET) ×2
CHLORAPREP W/TINT 26 (MISCELLANEOUS) ×2 IMPLANT
CLIP LIGATING HEM O LOK PURPLE (MISCELLANEOUS) ×2 IMPLANT
CLIP LIGATING HEMO LOK XL GOLD (MISCELLANEOUS) ×2 IMPLANT
CLIP LIGATING HEMO O LOK GREEN (MISCELLANEOUS) ×4 IMPLANT
COVER SURGICAL LIGHT HANDLE (MISCELLANEOUS) ×2 IMPLANT
COVER TIP SHEARS 8 DVNC (MISCELLANEOUS) ×1 IMPLANT
COVER TIP SHEARS 8MM DA VINCI (MISCELLANEOUS) ×1
CUTTER ECHEON FLEX ENDO 45 340 (ENDOMECHANICALS) ×2 IMPLANT
DECANTER SPIKE VIAL GLASS SM (MISCELLANEOUS) ×2 IMPLANT
DERMABOND ADVANCED (GAUZE/BANDAGES/DRESSINGS) ×1
DERMABOND ADVANCED .7 DNX12 (GAUZE/BANDAGES/DRESSINGS) ×1 IMPLANT
DRAPE ARM DVNC X/XI (DISPOSABLE) ×4 IMPLANT
DRAPE COLUMN DVNC XI (DISPOSABLE) ×1 IMPLANT
DRAPE DA VINCI XI ARM (DISPOSABLE) ×4
DRAPE DA VINCI XI COLUMN (DISPOSABLE) ×1
DRAPE INCISE IOBAN 66X45 STRL (DRAPES) ×2 IMPLANT
DRAPE SHEET LG 3/4 BI-LAMINATE (DRAPES) ×2 IMPLANT
ELECT PENCIL ROCKER SW 15FT (MISCELLANEOUS) ×2 IMPLANT
ELECT REM PT RETURN 15FT ADLT (MISCELLANEOUS) ×2 IMPLANT
GLOVE SURG ENC MOIS LTX SZ6.5 (GLOVE) ×2 IMPLANT
GLOVE SURG ENC TEXT LTX SZ7.5 (GLOVE) ×4 IMPLANT
GOWN STRL REUS W/TWL LRG LVL3 (GOWN DISPOSABLE) ×2 IMPLANT
GOWN STRL REUS W/TWL XL LVL3 (GOWN DISPOSABLE) ×4 IMPLANT
HOLDER FOLEY CATH W/STRAP (MISCELLANEOUS) ×2 IMPLANT
IRRIG SUCT STRYKERFLOW 2 WTIP (MISCELLANEOUS) ×2
IRRIGATION SUCT STRKRFLW 2 WTP (MISCELLANEOUS) ×1 IMPLANT
KIT BASIN OR (CUSTOM PROCEDURE TRAY) ×2 IMPLANT
KIT TURNOVER KIT A (KITS) IMPLANT
MARKER SKIN DUAL TIP RULER LAB (MISCELLANEOUS) ×2 IMPLANT
NEEDLE INSUFFLATION 14GA 120MM (NEEDLE) ×2 IMPLANT
PROTECTOR NERVE ULNAR (MISCELLANEOUS) ×4 IMPLANT
SEAL CANN UNIV 5-8 DVNC XI (MISCELLANEOUS) ×4 IMPLANT
SEAL XI 5MM-8MM UNIVERSAL (MISCELLANEOUS) ×4
SET TUBE SMOKE EVAC HIGH FLOW (TUBING) ×2 IMPLANT
SOLUTION ELECTROLUBE (MISCELLANEOUS) ×2 IMPLANT
STAPLE RELOAD 45 WHT (STAPLE) ×4 IMPLANT
STAPLE RELOAD 45MM WHITE (STAPLE) ×4
SUT MNCRL AB 4-0 PS2 18 (SUTURE) ×4 IMPLANT
SUT PDS AB 0 CT1 36 (SUTURE) ×4 IMPLANT
SUT VIC AB 2-0 SH 27 (SUTURE) ×1
SUT VIC AB 2-0 SH 27X BRD (SUTURE) ×1 IMPLANT
SUT VICRYL 0 UR6 27IN ABS (SUTURE) ×4 IMPLANT
TOWEL OR 17X26 10 PK STRL BLUE (TOWEL DISPOSABLE) ×2 IMPLANT
TOWEL OR NON WOVEN STRL DISP B (DISPOSABLE) ×2 IMPLANT
TRAY FOLEY MTR SLVR 16FR STAT (SET/KITS/TRAYS/PACK) ×2 IMPLANT
TRAY LAPAROSCOPIC (CUSTOM PROCEDURE TRAY) ×2 IMPLANT
TROCAR BLADELESS OPT 5 100 (ENDOMECHANICALS) IMPLANT
TROCAR XCEL 12X100 BLDLESS (ENDOMECHANICALS) ×2 IMPLANT
WATER STERILE IRR 1000ML POUR (IV SOLUTION) ×2 IMPLANT
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
## 2021-08-29 NOTE — Transfer of Care (Signed)
Immediate Anesthesia Transfer of Care Note  Patient: Gavin Hernandez  Procedure(s) Performed: XI ROBOTIC ASSISTED LAPAROSCOPIC RADICAL NEPHRECTOMY (Right)  Patient Location: PACU  Anesthesia Type:General  Level of Consciousness: awake, alert , oriented and patient cooperative  Airway & Oxygen Therapy: Patient Spontanous Breathing and Patient connected to face mask oxygen  Post-op Assessment: Report given to RN and Post -op Vital signs reviewed and stable  Post vital signs: Reviewed and stable  Last Vitals:  Vitals Value Taken Time  BP 144/83 08/29/21 1600  Temp    Pulse 76 08/29/21 1603  Resp 19 08/29/21 1603  SpO2 100 % 08/29/21 1603  Vitals shown include unvalidated device data.  Last Pain:  Vitals:   08/29/21 1035  TempSrc:   PainSc: 0-No pain         Complications: No notable events documented.

## 2021-08-29 NOTE — Discharge Instructions (Signed)

## 2021-08-29 NOTE — Anesthesia Postprocedure Evaluation (Signed)
Anesthesia Post Note  Patient: Gavin Hernandez  Procedure(s) Performed: XI ROBOTIC ASSISTED LAPAROSCOPIC RADICAL NEPHRECTOMY (Right)     Patient location during evaluation: PACU Anesthesia Type: General Level of consciousness: awake and alert Pain management: pain level controlled Vital Signs Assessment: post-procedure vital signs reviewed and stable Respiratory status: spontaneous breathing, nonlabored ventilation, respiratory function stable and patient connected to nasal cannula oxygen Cardiovascular status: blood pressure returned to baseline and stable Postop Assessment: no apparent nausea or vomiting Anesthetic complications: no   No notable events documented.  Last Vitals:  Vitals:   08/29/21 1644 08/29/21 1708  BP:  122/72  Pulse: 70 72  Resp: 17 20  Temp:  36.5 C  SpO2: 95% (!) 87%    Last Pain:  Vitals:   08/29/21 1708  TempSrc: Oral  PainSc:                  Effie Berkshire

## 2021-08-29 NOTE — Anesthesia Procedure Notes (Signed)
Procedure Name: Intubation Date/Time: 08/29/2021 1:37 PM Performed by: Garrel Ridgel, CRNA Pre-anesthesia Checklist: Patient identified, Emergency Drugs available, Suction available and Patient being monitored Patient Re-evaluated:Patient Re-evaluated prior to induction Oxygen Delivery Method: Circle system utilized Preoxygenation: Pre-oxygenation with 100% oxygen Induction Type: IV induction Ventilation: Mask ventilation without difficulty Laryngoscope Size: Mac and 4 Grade View: Grade II Tube type: Oral Tube size: 7.5 mm Number of attempts: 1 Airway Equipment and Method: Stylet and Oral airway Placement Confirmation: ETT inserted through vocal cords under direct vision, positive ETCO2 and breath sounds checked- equal and bilateral Secured at: 23 cm Tube secured with: Tape Dental Injury: Teeth and Oropharynx as per pre-operative assessment

## 2021-08-29 NOTE — Anesthesia Preprocedure Evaluation (Addendum)
Anesthesia Evaluation  Patient identified by MRN, date of birth, ID band Patient awake    Reviewed: Allergy & Precautions, NPO status , Patient's Chart, lab work & pertinent test results  Airway Mallampati: II  TM Distance: >3 FB Neck ROM: Full    Dental  (+) Teeth Intact   Pulmonary neg pulmonary ROS,    breath sounds clear to auscultation       Cardiovascular hypertension, Pt. on medications  Rhythm:Regular Rate:Normal     Neuro/Psych negative neurological ROS  negative psych ROS   GI/Hepatic Neg liver ROS, GERD  Medicated,  Endo/Other  negative endocrine ROS  Renal/GU      Musculoskeletal  (+) Arthritis ,   Abdominal Normal abdominal exam  (+)   Peds  Hematology negative hematology ROS (+)   Anesthesia Other Findings   Reproductive/Obstetrics                            Anesthesia Physical Anesthesia Plan  ASA: 2  Anesthesia Plan: General   Post-op Pain Management:    Induction: Intravenous  PONV Risk Score and Plan: 3 and Ondansetron, Dexamethasone and Treatment may vary due to age or medical condition  Airway Management Planned: Oral ETT  Additional Equipment: None  Intra-op Plan:   Post-operative Plan: Extubation in OR  Informed Consent: I have reviewed the patients History and Physical, chart, labs and discussed the procedure including the risks, benefits and alternatives for the proposed anesthesia with the patient or authorized representative who has indicated his/her understanding and acceptance.       Plan Discussed with: CRNA  Anesthesia Plan Comments: (- 2 IV's)       Anesthesia Quick Evaluation

## 2021-08-29 NOTE — Op Note (Signed)
Operative Note  Preoperative diagnosis:  1.  5.2 cm right renal mass  Postoperative diagnosis: 1.  5.2 cm right renal mass  Procedure(s): 1.  Robot-assisted laparoscopic right radical nephrectomy (adrenal sparing) 2.  Intraoperative ultrasound of single retroperitoneal organ  Surgeon: Ellison Hughs, MD  Assistants: Debbrah Alar, PA-C  An assistant was required for this surgical procedure.  The duties of the assistant included but were not limited to suctioning, passing suture, camera manipulation, retraction.  This procedure would not be able to be performed without an Environmental consultant.   Anesthesia:  General  Complications:  None  EBL: 50 mL  Specimens: 1.  Right kidney  Drains/Catheters: 1.  Foley catheter  Intraoperative findings:   Dilated inferior vena cava.  Intraoperative ultrasound of the right renal vein and vena cava confirmed there was no tumor thrombus in either vessel  Indication:  Gavin Hernandez is a 75 y.o. 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  He has been consented for the above procedures, voices understanding and wishes to proceed.  Description of procedure:  After informed consent was signed, the patient was taken back to the operating room and properly anesthetized.  The patient was then placed in the left lateral decubitus position with all pressure points padded.  The abdomen was then prepped and draped in the usual sterile fashion.  A time-out was then performed.     An 8 mm incision was then made lateral to the right rectus muscle at the level of the right 12th rib.  A Veress needle was then used to access the abdominal cavity.  A saline drop test showed no signs of obstruction and aspiration of the Veress needle revealed no blood or sucus.  The abdominal cavity was then insufflated to 15 mmHg.  An 8 mm robotic trocar was then atraumatically inserted into the abdominal cavity.  The robotic  camera was then inserted through the port and inspection of the abdominal cavity revealed no evidence of adjacent organ or vessel injury. We then placed three additional 8 mm robotic ports, a 12 mm assistant port and a 5 mm subxiphoid port in such as fashion as to triangulate the right renal hilum.  The robot was then docked into postion.   Using a combination of blunt and cold scissors dissection, the hepatic attachments were released from the abdominal sidewall.  A locking grasper was then inserted through the 5 mm sub-xyphoid port and used to retract the posterior surface of the liver more cephalad.  The white line of Toldt along the ascending colon was then incised, allowing [place] to reflect the colon medially and expose the anterior surface of the right kidney.  The duodenum was then Kocherized medially, which abruptly led [place] to the identification of the inferior vena cava.    Once the colon was adequately mobilized, we moved to the lower pole and identified the gonadal vein and ureter.  The gonadal vein was then left running parallel to the vena cava and the right ureter was reflected anteriorly.  Using cautious cautery, the overlying perihilar attachments were then released.  This yielded visualization of the renal hilum, which included a single right renal vein and 2 renal arteries.  The perilymphatic tissue surrounding the right renal arteries were carefully released so that the vessels were fully encircled.  The vena cava grossly looked engorged, but fully compressible.  Intraoperative ultrasound was used to assess the right renal vein and vena  cava. There is no evidence of debris within the renal vein or vena cava seen on intraoperative ultrasound.    A 45 mm powered endovascular stapler was then used to ligate the right renal arteries and vein, separately.  The right hilar stump was hemostatic following staple ligation.  Hemoclips were then applied to the proximal aspects of the right ureter, which  was then sharply incised.  The remaining perinephric attachments were then incised using electrocautery. Reinspection of the right retroperitoneal space revealed excellent hemostasis. Once the right kidney was fully mobile, it was placed in an Endo Catch bag and left in the abdominal cavity.   The 12 mm midline assistant port was then closed using the [employer] technique with a 0 Vicryl suture.  A right lower quadrant Gibson incision was then made in the right kidney was removed within the Endo Catch bag.  The fascia of the external and internal oblique were then closed with a running 0 PDS suture.  The skin incisions were then closed using 4-0 Monocryl.  Dermabond was applied to all skin incisions.  Plan: Monitor on the floor overnight

## 2021-08-30 ENCOUNTER — Encounter (HOSPITAL_COMMUNITY): Payer: Self-pay | Admitting: Urology

## 2021-08-30 LAB — BASIC METABOLIC PANEL
Anion gap: 7 (ref 5–15)
BUN: 25 mg/dL — ABNORMAL HIGH (ref 8–23)
CO2: 26 mmol/L (ref 22–32)
Calcium: 8.4 mg/dL — ABNORMAL LOW (ref 8.9–10.3)
Chloride: 102 mmol/L (ref 98–111)
Creatinine, Ser: 1.49 mg/dL — ABNORMAL HIGH (ref 0.61–1.24)
GFR, Estimated: 49 mL/min — ABNORMAL LOW (ref >60)
Glucose, Bld: 190 mg/dL — ABNORMAL HIGH (ref 70–99)
Potassium: 4.2 mmol/L (ref 3.5–5.1)
Sodium: 135 mmol/L (ref 135–145)

## 2021-08-30 LAB — HEMOGLOBIN AND HEMATOCRIT, BLOOD
HCT: 43.1 % (ref 39.0–52.0)
Hemoglobin: 14.7 g/dL (ref 13.0–17.0)

## 2021-08-30 MED ORDER — CALCIUM CARBONATE ANTACID 500 MG PO CHEW
200.0000 mg | CHEWABLE_TABLET | Freq: Two times a day (BID) | ORAL | Status: DC | PRN
  Administered 2021-08-30 (×2): 200 mg via ORAL
  Filled 2021-08-30 (×2): qty 1

## 2021-08-30 MED ORDER — CHLORHEXIDINE GLUCONATE CLOTH 2 % EX PADS: 6.0000 | MEDICATED_PAD | Freq: Every day | CUTANEOUS | Status: DC

## 2021-08-30 MED ORDER — PANTOPRAZOLE SODIUM 40 MG PO TBEC
80.0000 mg | DELAYED_RELEASE_TABLET | Freq: Every day | ORAL | Status: DC
  Administered 2021-08-30 (×2): 80 mg via ORAL
  Filled 2021-08-30 (×3): qty 2

## 2021-08-30 NOTE — Discharge Summary (Signed)
Date of admission: 08/29/2021  Date of discharge: 08/30/2021  Admission diagnosis: 5.2cm right renal mass   Discharge diagnosis: 5.2cm right renal mass   Secondary diagnoses:  Patient Active Problem List   Diagnosis Date Noted   Renal mass 08/29/2021    Procedures performed: Procedure(s): XI ROBOTIC ASSISTED LAPAROSCOPIC RADICAL NEPHRECTOMY  History and Physical: For full details, please see admission history and physical. Briefly, Gavin Hernandez is a 75 y.o. year old patient with a 5.2cm right renal mass who presents for robotic right radical nephrectomy.   Hospital Course: Patient tolerated the procedure well.  He was then transferred to the floor after an uneventful PACU stay.  His hospital course was uncomplicated. Am labs showed an increase in creatinine to 1.5 but labs were otherwise unremarkable. His foley was removed in the morning on POD1 and he passed a TOV. He was ambulating independently and had no nausea or vomiting. On POD#1 he had met discharge criteria: was eating a regular diet, was up and ambulating independently,  pain was well controlled, was voiding without a catheter, and was ready to for discharge.  He has follow up on 11/18. Pathology was pending at time of discharge.    Laboratory values:  Recent Labs    08/29/21 1042 08/29/21 1643 08/30/21 0406  WBC 5.5  --   --   HGB 16.6 15.9 14.7  HCT 48.4 46.8 43.1   Recent Labs    08/29/21 1042 08/30/21 0406  NA 139 135  K 3.8 4.2  CL 106 102  CO2 25 26  GLUCOSE 89 190*  BUN 16 25*  CREATININE 0.88 1.49*  CALCIUM 9.2 8.4*   No results for input(s): LABPT, INR in the last 72 hours. No results for input(s): LABURIN in the last 72 hours. Results for orders placed or performed in visit on 08/26/21  SARS Coronavirus 2 (TAT 6-24 hrs)     Status: None   Collection Time: 08/26/21 12:00 AM  Result Value Ref Range Status   SARS Coronavirus 2 RESULT: NEGATIVE  Final    Comment: RESULT: NEGATIVESARS-CoV-2  INTERPRETATION:A NEGATIVE  test result means that SARS-CoV-2 RNA was not present in the specimen above the limit of detection of this test. This does not preclude a possible SARS-CoV-2 infection and should not be used as the  sole basis for patient management decisions. Negative results must be combined with clinical observations, patient history, and epidemiological information. Optimum specimen types and timing for peak viral levels during infections caused by SARS-CoV-2  have not been determined. Collection of multiple specimens or types of specimens may be necessary to detect virus. Improper specimen collection and handling, sequence variability under primers/probes, or organism present below the limit of detection may  lead to false negative results. Positive and negative predictive values of testing are highly dependent on prevalence. False negative test results are more likely when prevalence of disease is high.The expected result is NEGATIVE.Fact S heet for  Healthcare Providers: LocalChronicle.no Sheet for Patients: SalonLookup.es Reference Range - Negative     Disposition: Home  Discharge instruction: The patient was instructed to be ambulatory but told to refrain from heavy lifting, strenuous activity, or driving.   Discharge medications:  Allergies as of 08/30/2021       Reactions   Lactose Intolerance (gi) Diarrhea   Sulfa Antibiotics Hives, Itching        Medication List     STOP taking these medications    CoQ10 200 MG Caps   multivitamin  with minerals Tabs tablet   OSTEO BI-FLEX REGULAR STRENGTH PO   OVER THE COUNTER MEDICATION   Saw Palmetto 450 MG Caps       TAKE these medications    atorvastatin 20 MG tablet Commonly known as: LIPITOR Take 20 mg by mouth every evening.   diphenhydramine-acetaminophen 25-500 MG Tabs tablet Commonly known as: TYLENOL PM Take 1 tablet by mouth at bedtime as  needed (sleep).   docusate sodium 100 MG capsule Commonly known as: COLACE Take 1 capsule (100 mg total) by mouth 2 (two) times daily.   famotidine 40 MG tablet Commonly known as: PEPCID Take 40 mg by mouth every evening.   HYDROcodone-acetaminophen 5-325 MG tablet Commonly known as: Norco Take 1-2 tablets by mouth every 6 (six) hours as needed for moderate pain.   losartan 25 MG tablet Commonly known as: COZAAR Take 25 mg by mouth in the morning.   omeprazole 40 MG capsule Commonly known as: PRILOSEC Take 40 mg by mouth daily.   promethazine 12.5 MG tablet Commonly known as: PHENERGAN Take 1 tablet (12.5 mg total) by mouth every 4 (four) hours as needed for nausea or vomiting.   Restasis 0.05 % ophthalmic emulsion Generic drug: cycloSPORINE Place 1 drop into both eyes 2 (two) times daily.        Followup:   Follow-up Information     Ceasar Mons, MD Follow up on 09/12/2021.   Specialty: Urology Why: at 12:00 Contact information: [address] [place] [address] [phone]

## 2021-08-30 NOTE — Progress Notes (Signed)
Foley d/c'd as ordered. DTV, pt feels the urgency to void, will cont to monitor. SRP, RN

## 2021-08-30 NOTE — Progress Notes (Signed)
Pt abd is tight and distended. Ambulated in hall, BS positive, no flatus. MD updated, GERD meds and Tums given as ordered. Will continue to monitor. SRP, RN

## 2021-08-30 NOTE — Plan of Care (Signed)
  Problem: Education: Goal: Required Educational Video(s) Outcome: Progressing   Problem: Clinical Measurements: Goal: Postoperative complications will be avoided or minimized Outcome: Progressing   Problem: Skin Integrity: Goal: Demonstration of wound healing without infection will improve Outcome: Progressing   Problem: Education: Goal: Knowledge of General Education information will improve Description: Including pain rating scale, medication(s)/side effects and non-pharmacologic comfort measures Outcome: Progressing

## 2021-08-30 NOTE — Progress Notes (Signed)
Pt ready for d/c. Discharge instructions reviewed with patient and spouse. Acknowledged understanding. ALL questions addressed, and teaching and care of incisions complete. Medication picked up by spouse earlier. Pt transported home by spouse. SRP, RN

## 2021-09-03 LAB — SURGICAL PATHOLOGY

## 2021-09-23 ENCOUNTER — Ambulatory Visit: Payer: PRIVATE HEALTH INSURANCE | Admitting: Internal Medicine

## 2021-10-28 NOTE — Progress Notes (Deleted)
Cardiology Office Note:    Date:  10/28/2021   ID:  Gavin Hernandez, DOB 03/12/1946, MRN [id number]  PCP:  Glenis Smoker, [facility] Providers Cardiologist:  Lenna Sciara, MD Referring MD: Glenis Smoker, *   Chief Complaint/Reason for Referral:  ***1}   ASSESSMENT:    No diagnosis found.    PLAN:    In order of problems listed above:            {Are you ordering a CV Procedure (e.g. stress test, cath, DCCV, TEE, etc)?   Press F2        :[id number]}   Dispo:  No follow-ups on file.     Medication Adjustments/Labs and Tests Ordered: Current medicines are reviewed at length with the patient today.  Concerns regarding medicines are outlined above.   Tests Ordered: No orders of the defined types were placed in this encounter.   Medication Changes: No orders of the defined types were placed in this encounter.   History of Present Illness:    FOCUSED CARDIOVASCULAR PROBLEM LIST:  ***   The patient is a 76 y.o. male with the indicated medical history here for ***     Previous Medical History: Past Medical History:  Diagnosis Date   AAA (abdominal aortic aneurysm) without rupture    Abnormal echocardiogram    Aortic valve sclerosis    Arthritis    Atrial fibrillation (HCC)    Cancer (HCC)    Cardiac murmur    Dyslipidemia    GERD (gastroesophageal reflux disease)    History of atrial fibrillation    History of kidney stones    Hypertension    Inguinal hernia    Irregular heart beat    Lipidemia    LVH (left ventricular hypertrophy)    MVP (mitral valve prolapse)    Non-rheumatic mitral regurgitation    Non-rheumatic mitral regurgitation    OSA (obstructive sleep apnea)    Palpitations    PVD (peripheral vascular disease) (HCC)    Renal disorder    Right renal mass    Secondary polycythemia    Skin cancer    Stage 2 chronic kidney disease    Tremor      Current Medications: No outpatient medications have been marked  as taking for the 10/31/21 encounter (Appointment) with Early Osmond, MD.     Allergies:    Bactrim [sulfamethoxazole-trimethoprim], Lactose intolerance (gi), and Sulfa antibiotics   Social History:   Social History   Tobacco Use   Smoking status: Never   Smokeless tobacco: Never  Vaping Use   Vaping Use: Never used  Substance Use Topics   Alcohol use: Yes    Comment: occ   Drug use: Never     Family Hx: Family History  Problem Relation Age of Onset   Lung disease Mother    Other Father        MVA   Heart Problems Brother        PACEMAKER     Review of Systems:   Please see the history of present illness.    All other systems reviewed and are negative.  EKGs/Labs/Other Test Reviewed:    EKG:  EKG today:***; prior EKG: ***  Prior CV studies: *** {Select studies to display:26339}   Imaging studies that I have independently reviewed today: ***  Recent Labs: 08/05/2021: ALT 28 08/29/2021: Platelets 156 08/30/2021: BUN 25; Creatinine, Ser 1.49; Hemoglobin 14.7; Potassium 4.2; Sodium 135   Recent  Lipid Panel No results found for: CHOL, TRIG, HDL, LDLCALC, LDLDIRECT  Risk Assessment/Calculations:    {Does this patient have ATRIAL FIBRILLATION?:[phone]}      Physical Exam:    VS:  There were no vitals taken for this visit.   Wt Readings from Last 3 Encounters:  08/29/21 179 lb 14.3 oz (81.6 kg)  08/27/21 180 lb (81.6 kg)    GENERAL:  No apparent distress, AOx3 HEENT:  No carotid bruits, +2 carotid impulses, no scleral icterus CAR: RRR Irregular RR*** no murmurs***, gallops, rubs, or thrills RES:  Clear to auscultation bilaterally ABD:  Soft, nontender, nondistended, positive bowel sounds x 4 VASC:  +2 radial pulses, +2 carotid pulses, palpable pedal pulses NEURO:  CN 2-12 grossly intact; motor and sensory grossly intact PSYCH:  No active depression or anxiety EXT:  No edema, ecchymosis, or cyanosis  Signed, Early Osmond, MD  10/28/2021 12:43 PM     [facility] [place], [place], [place]  [address] Phone: [phone]; Fax: [phone]   Note:  This document was prepared using Dragon voice recognition software and may include unintentional dictation errors.

## 2021-10-30 NOTE — Progress Notes (Addendum)
Cardiology Office Note:    Date:  10/31/2021   ID:  Gavin Hernandez, DOB April 17, 1946, MRN [id number]  PCP:  Glenis Smoker, MD   [facility] Providers Cardiologist:  Lenna Sciara, MD Referring MD: Glenis Smoker, *   Chief Complaint/Reason for Referral: Establish cardiovascular care  ASSESSMENT:    MVP (mitral valve prolapse)  Hyperlipidemia, unspecified hyperlipidemia type  Hypertension, unspecified type    PLAN:    In order of problems listed above:  1.  We will obtain an echocardiogram as a baseline study.  Follow up in 1 year.  2.  This followed by his primary care provider.  3.  Blood pressure under good control today.    Dispo:  No follow-ups on file.     Medication Adjustments/Labs and Tests Ordered: Current medicines are reviewed at length with the patient today.  Concerns regarding medicines are outlined above.   Tests Ordered: No orders of the defined types were placed in this encounter.   Medication Changes: No orders of the defined types were placed in this encounter.   History of Present Illness:    FOCUSED CARDIOVASCULAR PROBLEM LIST:   1.  Polycythemia vera 2.  Hyperlipidemia 3.  Mitral valve prolapse 4.  Hypertension  The patient is a 76 y.o. male with the indicated medical history here to establish cardiovascular care.  He has known murmur that has been relatively stable for some time.  He is relatively active and reports no significant exertional dyspnea, chest pain, palpitations, presyncope, syncope, orthopnea, signs or symptoms of stroke, or need for urgent emergency room evaluation or hospitalization.  He is compliant with his medications and per report, his blood pressure is fairly well controlled.       Previous Medical History: Past Medical History:  Diagnosis Date   AAA (abdominal aortic aneurysm) without rupture    Abnormal echocardiogram    Aortic valve sclerosis    Arthritis    Atrial fibrillation (HCC)     Cancer (HCC)    Cardiac murmur    Dyslipidemia    GERD (gastroesophageal reflux disease)    History of atrial fibrillation    History of kidney stones    Hypertension    Inguinal hernia    Irregular heart beat    Lipidemia    LVH (left ventricular hypertrophy)    MVP (mitral valve prolapse)    Non-rheumatic mitral regurgitation    Non-rheumatic mitral regurgitation    OSA (obstructive sleep apnea)    Palpitations    PVD (peripheral vascular disease) (HCC)    Renal disorder    Right renal mass    Secondary polycythemia    Skin cancer    Stage 2 chronic kidney disease    Tremor      Current Medications: Current Meds  Medication Sig   atorvastatin (LIPITOR) 20 MG tablet Take 20 mg by mouth every evening.   Coenzyme Q10 (CO Q 10) 100 MG CAPS Take 1 tablet by mouth daily.   cycloSPORINE (RESTASIS) 0.05 % ophthalmic emulsion Place 1 drop into both eyes in the morning and at bedtime.   famotidine (PEPCID) 40 MG tablet Take 40 mg by mouth every evening.   losartan (COZAAR) 25 MG tablet Take 25 mg by mouth in the morning.   Multiple Vitamins-Minerals (MULTI FOR HIM 50+) TABS Take 1 tablet by mouth daily.   omeprazole (PRILOSEC) 40 MG capsule Take 40 mg by mouth daily.   RESTASIS 0.05 % ophthalmic emulsion Place 1 drop  into both eyes 2 (two) times daily.   Saw Palmetto 450 MG CAPS Take 1 tablet by mouth in the morning and at bedtime.     Allergies:    Bactrim [sulfamethoxazole-trimethoprim], Lactose intolerance (gi), and Sulfa antibiotics   Social History:   Social History   Tobacco Use   Smoking status: Never   Smokeless tobacco: Never  Vaping Use   Vaping Use: Never used  Substance Use Topics   Alcohol use: Yes    Comment: occ   Drug use: Never     Family Hx: Family History  Problem Relation Age of Onset   Lung disease Mother    Other Father        MVA   Heart Problems Brother        PACEMAKER     Review of Systems:   Please see the history of present  illness.    All other systems reviewed and are negative.  EKGs/Labs/Other Test Reviewed:    EKG:  prior EKG: Sinus bradycardia  Prior CV studies: No available imaging  Imaging studies that I have independently reviewed today:  Echocardiogram from outside hospital March 2022 demonstrated ejection fraction of 60% with mild focal inferobasal hypokinesis and mild to moderate mitral regurgitation.  MRI abdomen no abdominal aortic aneurysm  Recent Labs: 08/05/2021: ALT 28 08/29/2021: Platelets 156 08/30/2021: BUN 25; Creatinine, Ser 1.49; Hemoglobin 14.7; Potassium 4.2; Sodium 135   Labs 09/12/2021 with creatinine of 1.2, LDL 62, HDL 47, cholesterol 123, triglycerides 67  Recent Lipid Panel No results found for: CHOL, TRIG, HDL, LDLCALC, LDLDIRECT  Risk Assessment/Calculations:          Physical Exam:    VS:  BP 120/86    Pulse 63    Ht 5\' 9"  (1.753 m)    Wt 188 lb (85.3 kg)    SpO2 97%    BMI 27.76 kg/m    Wt Readings from Last 3 Encounters:  10/31/21 188 lb (85.3 kg)  08/29/21 179 lb 14.3 oz (81.6 kg)  08/27/21 180 lb (81.6 kg)    GENERAL:  No apparent distress, AOx3 HEENT:  No carotid bruits, +2 carotid impulses, no scleral icterus CAR: RRR soft systolic murmur, gallops, rubs, or thrills RES:  Clear to auscultation bilaterally ABD:  Soft, nontender, nondistended, positive bowel sounds x 4 VASC:  +2 radial pulses, +2 carotid pulses, palpable pedal pulses NEURO:  CN 2-12 grossly intact; motor and sensory grossly intact PSYCH:  No active depression or anxiety EXT:  No edema, ecchymosis, or cyanosis  Signed, Early Osmond, MD  10/31/2021 9:54 AM    [address], [place], [place]  [address] Phone: [phone]; Fax: [phone]   Note:  This document was prepared using Dragon voice recognition software and may include unintentional dictation errors.

## 2021-10-31 ENCOUNTER — Ambulatory Visit: Payer: Medicare Other | Admitting: Internal Medicine

## 2021-10-31 ENCOUNTER — Encounter: Payer: Self-pay | Admitting: Internal Medicine

## 2021-10-31 ENCOUNTER — Other Ambulatory Visit: Payer: Self-pay

## 2021-10-31 ENCOUNTER — Ambulatory Visit (INDEPENDENT_AMBULATORY_CARE_PROVIDER_SITE_OTHER): Payer: Medicare Other | Admitting: Internal Medicine

## 2021-10-31 VITALS — BP 120/86 | HR 63 | Ht 69.0 in | Wt 188.0 lb

## 2021-10-31 DIAGNOSIS — E785 Hyperlipidemia, unspecified: Secondary | ICD-10-CM

## 2021-10-31 DIAGNOSIS — I341 Nonrheumatic mitral (valve) prolapse: Secondary | ICD-10-CM | POA: Diagnosis not present

## 2021-10-31 DIAGNOSIS — I10 Essential (primary) hypertension: Secondary | ICD-10-CM

## 2021-10-31 NOTE — Patient Instructions (Signed)
Medication Instructions:  No changes *If you need a refill on your cardiac medications before your next appointment, please call your pharmacy*   Lab Work: none   Testing/Procedures: Your physician has requested that you have an echocardiogram. Echocardiography is a painless test that uses sound waves to create images of your heart. It provides your doctor with information about the size and shape of your heart and how well your hearts chambers and valves are working. This procedure takes approximately one hour. There are no restrictions for this procedure.   Follow-Up: At [facility], you and your health needs are our priority.  As part of our continuing mission to provide you with exceptional heart care, we have created designated Provider Care Teams.  These Care Teams include your primary Cardiologist (physician) and Advanced Practice Providers (APPs -  Physician Assistants and Nurse Practitioners) who all work together to provide you with the care you need, when you need it.  Your next appointment:   12 month(s)  The format for your next appointment:   In Person  Provider:   Early Osmond, MD     Other Instructions

## 2021-12-09 ENCOUNTER — Other Ambulatory Visit (HOSPITAL_COMMUNITY): Payer: Self-pay

## 2021-12-26 ENCOUNTER — Ambulatory Visit (HOSPITAL_COMMUNITY): Payer: Medicare Other | Attending: Cardiology

## 2021-12-26 ENCOUNTER — Other Ambulatory Visit: Payer: Self-pay

## 2021-12-26 DIAGNOSIS — I341 Nonrheumatic mitral (valve) prolapse: Secondary | ICD-10-CM | POA: Insufficient documentation

## 2021-12-26 DIAGNOSIS — I10 Essential (primary) hypertension: Secondary | ICD-10-CM | POA: Insufficient documentation

## 2021-12-26 DIAGNOSIS — E785 Hyperlipidemia, unspecified: Secondary | ICD-10-CM | POA: Insufficient documentation

## 2021-12-26 LAB — ECHOCARDIOGRAM COMPLETE
AV Mean grad: 9.3 mmHg
AV Peak grad: 19.2 mmHg
Ao pk vel: 2.19 m/s
Area-P 1/2: 2.5 cm2
S' Lateral: 3.1 cm

## 2022-01-07 ENCOUNTER — Other Ambulatory Visit: Payer: Self-pay

## 2022-01-07 ENCOUNTER — Ambulatory Visit (HOSPITAL_COMMUNITY): Admission: RE | Admit: 2022-01-07 | Payer: Medicare Other | Source: Ambulatory Visit

## 2022-01-07 ENCOUNTER — Ambulatory Visit (HOSPITAL_COMMUNITY)
Admission: RE | Admit: 2022-01-07 | Discharge: 2022-01-07 | Disposition: A | Discharge status: Home / Self Care | Payer: Medicare Other | Source: Ambulatory Visit | Attending: Urology | Admitting: Urology

## 2022-01-07 ENCOUNTER — Encounter (HOSPITAL_COMMUNITY): Payer: Self-pay

## 2022-01-07 ENCOUNTER — Other Ambulatory Visit (HOSPITAL_COMMUNITY): Payer: Self-pay | Admitting: Urology

## 2022-01-07 DIAGNOSIS — Z85528 Personal history of other malignant neoplasm of kidney: Secondary | ICD-10-CM

## 2022-02-13 ENCOUNTER — Encounter: Payer: Self-pay | Admitting: Physical Medicine & Rehabilitation

## 2022-03-19 ENCOUNTER — Ambulatory Visit (INDEPENDENT_AMBULATORY_CARE_PROVIDER_SITE_OTHER): Payer: Medicare Other

## 2022-03-19 ENCOUNTER — Ambulatory Visit: Payer: Medicare Other

## 2022-03-19 ENCOUNTER — Ambulatory Visit (INDEPENDENT_AMBULATORY_CARE_PROVIDER_SITE_OTHER): Payer: Medicare Other | Admitting: Podiatry

## 2022-03-19 DIAGNOSIS — M79671 Pain in right foot: Secondary | ICD-10-CM

## 2022-03-19 DIAGNOSIS — D3613 Benign neoplasm of peripheral nerves and autonomic nervous system of lower limb, including hip: Secondary | ICD-10-CM | POA: Diagnosis not present

## 2022-03-19 DIAGNOSIS — M7742 Metatarsalgia, left foot: Secondary | ICD-10-CM

## 2022-03-19 DIAGNOSIS — M79675 Pain in left toe(s): Secondary | ICD-10-CM | POA: Diagnosis not present

## 2022-03-19 DIAGNOSIS — M79674 Pain in right toe(s): Secondary | ICD-10-CM

## 2022-03-19 DIAGNOSIS — B351 Tinea unguium: Secondary | ICD-10-CM

## 2022-03-19 DIAGNOSIS — D3610 Benign neoplasm of peripheral nerves and autonomic nervous system, unspecified: Secondary | ICD-10-CM

## 2022-03-19 MED ORDER — TRIAMCINOLONE ACETONIDE 10 MG/ML IJ SUSP
10.0000 mg | Freq: Once | INTRAMUSCULAR | Status: AC
  Administered 2022-03-19: 10 mg

## 2022-03-19 MED ORDER — CICLOPIROX 8 % EX SOLN: Freq: Every day | CUTANEOUS | 0 refills | Status: DC

## 2022-03-19 NOTE — Patient Instructions (Signed)
While at your visit today you received a steroid injection in your foot or ankle to help with your pain. Along with having the steroid medication there is some "numbing" medication in the shot that you received. Due to this you may notice some numbness to the area for the next couple of hours.   I would recommend limiting activity for the next few days to help the steroid injection take affect.    The actually benefit from the steroid injection may take up to 2-7 days to see a difference. You may actually experience a small (as in 10%) INCREASE in pain in the first 24 hours---that is common. It would be best if you can ice the area today and take anti-inflammatory medications (such as Ibuprofen, Motrin, or Aleve) if you are able to take these medications. If you were prescribed another medication to help with the pain go ahead and start that medication today    Things to watch out for that you should contact us or a health care provider urgently would include: 1. Unusual (as in more than 10%) increase in pain 2. New fever > 101.5 3. New swelling or redness of the injected area.  4. Streaking of red lines around the area injected.  If you have any questions or concerns about this, please give our office a call at [phone].   Morton Neuralgia  Morton neuralgia is foot pain that affects the ball of the foot and the area near the toes. Morton neuralgia occurs when part of a nerve in the foot (digital nerve) is under too much pressure (compressed). When this happens over a long period of time, the nerve can thicken (neuroma) and cause pain. Pain usually occurs between the third and fourth toes.  Morton neuralgia can come and go but may get worse over time. What are the causes? This condition is caused by doing the same things over and over with your foot, such as: Activities such as running or jumping. Wearing shoes that are too tight. What increases the risk? You may be at higher risk for  Morton neuralgia if you: Are male. Wear high heels. Wear shoes that are narrow or tight. Do activities that repeatedly stretch your toes, such as: Running. Ballet. Long-distance walking. What are the signs or symptoms? The first symptom of Morton neuralgia is pain that spreads from the ball of the foot to the toes. It may feel like you are walking on a marble. Pain usually gets worse with walking and goes away at night. Other symptoms may include numbness and cramping of your toes. Both feet are equally affected, but rarely at the same time. How is this diagnosed? This condition is diagnosed based on your symptoms, your medical history, and a physical exam. Your health care provider may: Squeeze your foot just behind your toe. Ask you to move your toes to check for pain. Ask about your physical activity level. You also may have imaging tests, such as an X-ray, ultrasound, or MRI. How is this treated? Treatment depends on how severe your condition is and what causes it. Treatment may involve: Wearing different shoes that are not too tight, are low-heeled, and provide good support. For some people, this is the only treatment needed. Wearing an over-the-counter or custom supportive pad (orthotic) under the front of your foot. Getting injections of numbing medicine and anti-inflammatory medicine (steroid) in the nerve. Having surgery to remove part of the thickened nerve. Follow these instructions at home: Managing pain, stiffness, and   swelling  Massage your foot as needed. Wear orthotics as told by your health care provider. If directed, put ice on the painful area. To do this: Put ice in a plastic bag. Place a towel between your skin and the bag. Leave the ice on for 20 minutes, 2-3 times a day. Remove the ice if your skin turns bright red. This is very important. If you cannot feel pain, heat, or cold, you have a greater risk of damage to the area. Raise (elevate) the injured area  above the level of your heart while you are sitting or lying down. Avoid activities that cause pain or make pain worse. If you play sports, ask your health care provider when it is safe for you to return to sports. General instructions Take over-the-counter and prescription medicines only as told by your health care provider. For the time period you were told by your health care provider, do not drive or use machinery. Wear shoes that: Have soft soles. Have a wide toe area. Provide arch support. Do not pinch or squeeze your feet. Have room for your orthotics, if this applies. Keep all follow-up visits. This is important. Contact a health care provider if: Your symptoms get worse or do not get better with treatment and home care. Summary Morton neuralgia is foot pain that affects the ball of the foot and the area near the toes. Pain usually occurs between the third and fourth toes, gets worse with walking, and goes away at night. Morton neuralgia occurs when part of a nerve in the foot (digital nerve) is under too much pressure. When this happens over a long period of time, the nerve can thicken (neuroma) and cause pain. This condition is caused by doing the same things over and over with your foot, such as running or jumping, wearing shoes that are too tight, or wearing high heels. Treatment may involve wearing low-heeled shoes that are not too tight, wearing a supportive pad (orthotic) under the front of your foot, getting injections in the nerve, or having surgery to remove part of the thickened nerve. This information is not intended to replace advice given to you by your health care provider. Make sure you discuss any questions you have with your health care provider. Document Revised: 03/21/2021 Document Reviewed: 03/21/2021 Elsevier Patient Education  2023 [employer] Inc[employer]  
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
## 2022-03-25 ENCOUNTER — Other Ambulatory Visit: Payer: Self-pay | Admitting: Podiatry

## 2022-03-25 DIAGNOSIS — M7742 Metatarsalgia, left foot: Secondary | ICD-10-CM

## 2022-03-31 ENCOUNTER — Encounter: Payer: Self-pay | Admitting: Neurology

## 2022-03-31 ENCOUNTER — Ambulatory Visit: Payer: Medicare Other | Admitting: Neurology

## 2022-03-31 VITALS — BP 146/91 | HR 59 | Ht 69.0 in | Wt 185.0 lb

## 2022-03-31 DIAGNOSIS — H539 Unspecified visual disturbance: Secondary | ICD-10-CM

## 2022-03-31 DIAGNOSIS — H532 Diplopia: Secondary | ICD-10-CM | POA: Diagnosis not present

## 2022-03-31 DIAGNOSIS — G250 Essential tremor: Secondary | ICD-10-CM | POA: Diagnosis not present

## 2022-03-31 DIAGNOSIS — I679 Cerebrovascular disease, unspecified: Secondary | ICD-10-CM

## 2022-03-31 DIAGNOSIS — M1120 Other chondrocalcinosis, unspecified site: Secondary | ICD-10-CM | POA: Insufficient documentation

## 2022-03-31 NOTE — Progress Notes (Signed)
Chief Complaint  Patient presents with   New Patient (Initial Visit)    Room 15 NP/[facility] [address] MD [phone] optic migraines, hx essential tremor      ASSESSMENT AND PLAN  Gavin Hernandez is a 76 y.o. male   Essential tremor  Long  history, no parkinsonian features,  TSH  Discussed with patient about treatment options, he does not want to take any medication for it Cerebral small vessel disease  CT angiogram of head and neck showed no large vessel disease, echocardiogram showed no significant abnormality  Vascular risk factors of aging, hypertension, hyperlipidemia, Advised him to take aspirin 81 mg daily, increase water intake  Transient binocular diplopia Bilateral exophoria on examination, could due to transient breakdown of binocular fusion, acetylcholine receptor binding antibody to rule out neuromuscular junctional disorder  Will only return to clinic for new issues       DIAGNOSTIC DATA (LABS, IMAGING, TESTING) - I reviewed patient records, labs, notes, testing and imaging myself where available.  Lab in April 2023, normal CBC with hemoglobin of 16.1, CMP creatinine of 0.97,   MEDICAL HISTORY:  Gavin Hernandez is a 76 year old male, seen in request by his primary care doctor Sela Hilding for evaluation of tremor, migraine, initial evaluation was on March 31, 2022  I reviewed and summarized the referring note. PMHX. HLD HTN Peripheral vascular disease. Hx of atrial fibrillation Right kidney cancer, s/p nephrectomy Lumbar decompression Hx of cervical fracture, hx of playing football.  He is a retired Forensic psychologist, complains of gradual onset bilateral hands tremor for many years, gradually getting worse, he denies a family history of essential tremor, it is more of a nuisance for him, no major limitation on his daily function  He reported to episode of sudden onset visual changes, the first episode was  in October 2022, while [place] the bridge, he had sudden onset difficulty focusing, lasting for less than 1 minute, was treated at emergency room, but then his symptoms went away  Personally reviewed MRI of the brain August 05, 2021: No acute abnormality, mild small vessel disease  CT angiogram of head and neck showed no large vessels disease  Echocardiogram ejection fraction 60 to 65%, no significant structural abnormality  Laboratory evaluation in 2022 showed normal or negative CMP ESR, C-reactive protein, CBC, It happened again in November 2022, while he was watching TV, he had double vision, he was able to pass closing 1 eye versus the other, able to identify it was binocular diplopia, he has clear vision using 1 eye only  He denies headache, no vertigo, no confusion during the spells,  PHYSICAL EXAM:   Vitals:   03/31/22 1043  BP: (!) 146/91  Pulse: (!) 59    PHYSICAL EXAMNIATION:  Gen: NAD, conversant, well nourised, well groomed                     Cardiovascular: Regular rate rhythm, no peripheral edema, warm, nontender. Eyes: Conjunctivae clear without exudates or hemorrhage Neck: Supple, no carotid bruits. Pulmonary: Clear to auscultation bilaterally   NEUROLOGICAL EXAM:  MENTAL STATUS: Speech/cognition: Awake, alert, oriented to history taking and casual conversation CRANIAL NERVES: CN II: Visual fields are full to confrontation. Pupils are round equal and briskly reactive to light. CN III, IV, VI: extraocular movement are normal. No ptosis.  Covering uncover showed mild bilateral exophoria CN V: Facial sensation is intact to light touch CN VII: Face is symmetric with normal eye  closure  CN VIII: Hearing is normal to causal conversation. CN IX, X: Phonation is normal. CN XI: Head turning and shoulder shrug are intact  MOTOR: Mild bilateral hand postural tremor, muscle bulk and tone are normal. Muscle strength is normal.  REFLEXES: Reflexes are 2+ and  symmetric at the biceps, triceps, knees, and ankles. Plantar responses are flexor.  SENSORY: Intact to light touch, pinprick and vibratory sensation are intact in fingers and toes.  COORDINATION: There is no trunk or limb dysmetria noted.  GAIT/STANCE: Posture is normal. Gait is steady with normal steps, base, arm swing, and turning. Heel and toe walking are normal. Tandem gait is normal.  Romberg is absent.  REVIEW OF SYSTEMS:  Full 14 system review of systems performed and notable only for as above All other review of systems were negative.   ALLERGIES: Allergies  Allergen Reactions   Bactrim [Sulfamethoxazole-Trimethoprim]    Lactose Intolerance (Gi) Diarrhea   Sulfa Antibiotics Hives and Itching    HOME MEDICATIONS: Current Outpatient Medications  Medication Sig Dispense Refill   atorvastatin (LIPITOR) 20 MG tablet Take 20 mg by mouth every evening.     ciclopirox (PENLAC) 8 % solution Apply topically at bedtime. Apply over nail and surrounding skin. Apply daily over previous coat. After seven (7) days, may remove with alcohol and continue cycle. 6.6 mL 0   Coenzyme Q10 (CO Q 10) 100 MG CAPS Take 1 tablet by mouth daily.     cycloSPORINE (RESTASIS) 0.05 % ophthalmic emulsion Place 1 drop into both eyes in the morning and at bedtime.     diphenhydramine-acetaminophen (TYLENOL PM) 25-500 MG TABS tablet Take 1 tablet by mouth at bedtime as needed (sleep). (Patient not taking: Reported on 10/31/2021)     docusate sodium (COLACE) 100 MG capsule Take 1 capsule (100 mg total) by mouth 2 (two) times daily. (Patient not taking: Reported on 10/31/2021)     famotidine (PEPCID) 40 MG tablet Take 40 mg by mouth every evening.     guaifenesin (HUMIBID E) 400 MG TABS tablet Take 400 mg by mouth every 4 (four) hours. (Patient not taking: Reported on 10/31/2021)     HYDROcodone-acetaminophen (NORCO) 5-325 MG tablet Take 1-2 tablets by mouth every 6 (six) hours as needed for moderate pain. (Patient  not taking: Reported on 10/31/2021) 20 tablet 0   losartan (COZAAR) 25 MG tablet Take 25 mg by mouth in the morning.     montelukast (SINGULAIR) 10 MG tablet Take 10 mg by mouth at bedtime. (Patient not taking: Reported on 10/31/2021)     Multiple Vitamins-Minerals (MULTI FOR HIM 50+) TABS Take 1 tablet by mouth daily.     omeprazole (PRILOSEC) 40 MG capsule Take 40 mg by mouth daily.     promethazine (PHENERGAN) 12.5 MG tablet Take 1 tablet (12.5 mg total) by mouth every 4 (four) hours as needed for nausea or vomiting. (Patient not taking: Reported on 10/31/2021) 15 tablet 0   RESTASIS 0.05 % ophthalmic emulsion Place 1 drop into both eyes 2 (two) times daily.     Saw Palmetto 450 MG CAPS Take 1 tablet by mouth in the morning and at bedtime.     No current facility-administered medications for this visit.    PAST MEDICAL HISTORY: Past Medical History:  Diagnosis Date   AAA (abdominal aortic aneurysm) without rupture    Abnormal echocardiogram    Aortic valve sclerosis    Arthritis    Atrial fibrillation ([place])    Cancer ([place])  Cardiac murmur    Dyslipidemia    GERD (gastroesophageal reflux disease)    History of atrial fibrillation    History of kidney stones    Hypertension    Inguinal hernia    Irregular heart beat    Lipidemia    LVH (left ventricular hypertrophy)    MVP (mitral valve prolapse)    Non-rheumatic mitral regurgitation    Non-rheumatic mitral regurgitation    OSA (obstructive sleep apnea)    Palpitations    PVD (peripheral vascular disease) (HCC)    Renal disorder    Right renal mass    Secondary polycythemia    Skin cancer    Stage 2 chronic kidney disease    Tremor     PAST SURGICAL HISTORY: Past Surgical History:  Procedure Laterality Date   HERNIA REPAIR     no mesh   KNEE ARTHROSCOPY Left    x2   LUMBAR FUSION     ROBOT ASSISTED LAPAROSCOPIC NEPHRECTOMY Right 08/29/2021   Procedure: XI ROBOTIC ASSISTED LAPAROSCOPIC RADICAL NEPHRECTOMY;  Surgeon:  Ceasar Mons, MD;  Location: [facility];  Service: Urology;  Laterality: Right;  ONLY NEEDS 180 MIN   SHOULDER ARTHROSCOPY     thumb surgery      FAMILY HISTORY: Family History  Problem Relation Age of Onset   Lung disease Mother    Other Father        MVA   Heart Problems Brother        PACEMAKER    SOCIAL HISTORY: Social History   Socioeconomic History   Marital status: Married    Spouse name: Not on file   Number of children: Not on file   Years of education: Not on file   Highest education level: Not on file  Occupational History   Not on file  Tobacco Use   Smoking status: Never   Smokeless tobacco: Never  Vaping Use   Vaping Use: Never used  Substance and Sexual Activity   Alcohol use: Yes    Comment: occ   Drug use: Never   Sexual activity: Not on file  Other Topics Concern   Not on file  Social History Narrative   Not on file   Social Determinants of Health   Financial Resource Strain: Not on file  Food Insecurity: Not on file  Transportation Needs: Not on file  Physical Activity: Not on file  Stress: Not on file  Social Connections: Not on file  Intimate Partner Violence: Not on file      Marcial Pacas, M.D. Ph.D.  [facility] [address], [place], [place] [address] Ph: [phone] Fax: [phone]  CC:  Glenis Smoker, MD [place],  [place] [id number]  Glenis Smoker, MD

## 2022-04-01 LAB — ACETYLCHOLINE RECEPTOR, BINDING: AChR Binding Ab, Serum: <0.03 nmol/L (ref 0.00–0.24)

## 2022-04-01 LAB — TSH: TSH: 1.22 u[IU]/mL (ref 0.450–4.500)

## 2022-04-03 ENCOUNTER — Ambulatory Visit: Payer: Medicare Other | Admitting: Neurology

## 2022-04-08 ENCOUNTER — Encounter: Payer: Self-pay | Admitting: Gastroenterology

## 2022-05-07 ENCOUNTER — Encounter: Payer: Self-pay | Admitting: Gastroenterology

## 2022-05-07 ENCOUNTER — Ambulatory Visit (INDEPENDENT_AMBULATORY_CARE_PROVIDER_SITE_OTHER): Payer: Medicare Other | Admitting: Gastroenterology

## 2022-05-07 ENCOUNTER — Other Ambulatory Visit (INDEPENDENT_AMBULATORY_CARE_PROVIDER_SITE_OTHER): Payer: Medicare Other

## 2022-05-07 VITALS — BP 128/80 | HR 92 | Ht 69.0 in | Wt 186.0 lb

## 2022-05-07 DIAGNOSIS — R140 Abdominal distension (gaseous): Secondary | ICD-10-CM | POA: Diagnosis not present

## 2022-05-07 DIAGNOSIS — K219 Gastro-esophageal reflux disease without esophagitis: Secondary | ICD-10-CM | POA: Diagnosis not present

## 2022-05-07 LAB — H. PYLORI ANTIBODY, IGG: H Pylori IgG: NEGATIVE

## 2022-05-07 MED ORDER — OMEPRAZOLE 20 MG PO CPDR: 20.0000 mg | DELAYED_RELEASE_CAPSULE | Freq: Every day | ORAL | 3 refills | Status: DC

## 2022-05-07 NOTE — Progress Notes (Signed)
HPI : Gavin Hernandez is a very pleasant 76 year old male with a history of renal cell carcinoma who is referred to [place] by Gavin Hernandez for further evaluation of chronic GERD symptoms and excessive gas/bloating.  The patient states he has had problems with gas and bloating as well as frequent bowel movements for 'decades'.  He takes and Gas-X frequently for these symptoms.  He also takes Prilosec '40mg'$  PO qam and Pepcid 20 mg qpm.  He denies heartburn or acid regurgitation.  He has been taking this acid suppressive regimen for many years.  No dysphagia.  No weight loss.  His appetite is good.  No nausea or vomiting. He has frequent bowel movements, typically 1-2 per day, but sometimes 4-5 in day.  Stools are typically formed and soft.  He has known hemorrhoids and passes blood with bowel movements most days.  No pain with passage of stool.  He also reports a history of a perianal abscess. His last colonoscopy was in 2014 in [place] and was normal per patient, recommended to repeat in 10 years.   He is very active, exercising 1.5 hours daily in addition to walking the neighborhood.  He is lactose intolerant so he avoids dairy.  He also avoids beans and cruciferous vegetables because these worsen his flatulence and bloating.    Past Medical History:  Diagnosis Date   AAA (abdominal aortic aneurysm) without rupture (HCC)    Abnormal echocardiogram    Aortic valve sclerosis    Arthritis    Atrial fibrillation (HCC)    Cancer (HCC)    skin & kidney   Cardiac murmur    Dyslipidemia    GERD (gastroesophageal reflux disease)    History of atrial fibrillation    History of kidney stones    Hypertension    Inguinal hernia    Irregular heart beat    Lipidemia    LVH (left ventricular hypertrophy)    MVP (mitral valve prolapse)    Non-rheumatic mitral regurgitation    Non-rheumatic mitral regurgitation    OSA (obstructive sleep apnea)    Palpitations    PVD (peripheral vascular disease)  (HCC)    Renal disorder    Right renal mass    Secondary polycythemia    Skin cancer    Stage 2 chronic kidney disease    Tremor      Past Surgical History:  Procedure Laterality Date   HERNIA REPAIR     no mesh   KNEE ARTHROSCOPY Left    x2   LUMBAR FUSION     ROBOT ASSISTED LAPAROSCOPIC NEPHRECTOMY Right 08/29/2021   Procedure: XI ROBOTIC ASSISTED LAPAROSCOPIC RADICAL NEPHRECTOMY;  Surgeon: Ceasar Mons, MD;  Location: [facility];  Service: Urology;  Laterality: Right;  ONLY NEEDS 180 MIN   SHOULDER ARTHROSCOPY     thumb surgery     Family History  Problem Relation Age of Onset   Lung disease Mother    Other Father        MVA   Diabetes Sister    Heart Problems Brother        PACEMAKER   Colon cancer Neg Hx    Stomach cancer Neg Hx    Esophageal cancer Neg Hx    Colon polyps Neg Hx    Social History   Tobacco Use   Smoking status: Never   Smokeless tobacco: Never  Vaping Use   Vaping Use: Never used  Substance Use Topics   Alcohol use: Yes  Comment: occ   Drug use: Never   Current Outpatient Medications  Medication Sig Dispense Refill   atorvastatin (LIPITOR) 20 MG tablet Take 20 mg by mouth every evening.     BABY ASPIRIN PO Take by mouth.     Coenzyme Q10 (CO Q 10) 100 MG CAPS Take 1 tablet by mouth daily.     cycloSPORINE (RESTASIS) 0.05 % ophthalmic emulsion Place 1 drop into both eyes in the morning and at bedtime.     diphenhydramine-acetaminophen (TYLENOL PM) 25-500 MG TABS tablet Take 1 tablet by mouth at bedtime as needed (sleep).     docusate sodium (COLACE) 100 MG capsule Take 1 capsule (100 mg total) by mouth 2 (two) times daily.     famotidine (PEPCID) 40 MG tablet Take 40 mg by mouth every evening.     guaifenesin (HUMIBID E) 400 MG TABS tablet Take 400 mg by mouth every 4 (four) hours.     losartan (COZAAR) 25 MG tablet Take 25 mg by mouth in the morning.     montelukast (SINGULAIR) 10 MG tablet Take 10 mg by mouth at bedtime.      Multiple Vitamins-Minerals (MULTI FOR HIM 50+) TABS Take 1 tablet by mouth daily.     omeprazole (PRILOSEC) 40 MG capsule Take 40 mg by mouth daily.     RESTASIS 0.05 % ophthalmic emulsion Place 1 drop into both eyes 2 (two) times daily.     Saw Palmetto 450 MG CAPS Take 1 tablet by mouth in the morning and at bedtime.     ciclopirox (PENLAC) 8 % solution Apply topically at bedtime. Apply over nail and surrounding skin. Apply daily over previous coat. After seven (7) days, may remove with alcohol and continue cycle. (Patient not taking: Reported on 05/07/2022) 6.6 mL 0   HYDROcodone-acetaminophen (NORCO) 5-325 MG tablet Take 1-2 tablets by mouth every 6 (six) hours as needed for moderate pain. (Patient not taking: Reported on 05/07/2022) 20 tablet 0   promethazine (PHENERGAN) 12.5 MG tablet Take 1 tablet (12.5 mg total) by mouth every 4 (four) hours as needed for nausea or vomiting. (Patient not taking: Reported on 05/07/2022) 15 tablet 0   No current facility-administered medications for this visit.   Allergies  Allergen Reactions   Bactrim [Sulfamethoxazole-Trimethoprim]    Lactose Intolerance (Gi) Diarrhea   Sulfa Antibiotics Hives and Itching     Review of Systems: All systems reviewed and negative except where noted in HPI.    No results found.  Physical Exam: BP 128/80   Pulse 92   Ht '5\' 9"'$  (1.753 m)   Wt 186 lb (84.4 kg)   SpO2 98%   BMI 27.47 kg/m  Constitutional: Pleasant,well-developed, Caucasian male in no acute distress.  Accompanied by wife HEENT: Normocephalic and atraumatic. Conjunctivae are normal. No scleral icterus. Cardiovascular: Normal rate, regular rhythm.  Pulmonary/chest: Effort normal and breath sounds normal. No wheezing, rales or rhonchi. Abdominal: Soft, nondistended, nontender. Bowel sounds active throughout. There are no masses palpable. No hepatomegaly. Extremities: no edema Neurological: Alert and oriented to person place and time. Skin: Skin is  warm and dry. No rashes noted. Psychiatric: Normal mood and affect. Behavior is normal.  CBC    Component Value Date/Time   WBC 5.5 08/29/2021 1042   RBC 5.26 08/29/2021 1042   HGB 14.7 08/30/2021 0406   HCT 43.1 08/30/2021 0406   PLT 156 08/29/2021 1042   MCV 92.0 08/29/2021 1042   MCH 31.6 08/29/2021 1042   MCHC  34.3 08/29/2021 1042   RDW 12.5 08/29/2021 1042   LYMPHSABS 2.6 08/05/2021 1639   MONOABS 0.7 08/05/2021 1639   EOSABS 0.2 08/05/2021 1639   BASOSABS 0.1 08/05/2021 1639    CMP     Component Value Date/Time   NA 135 08/30/2021 0406   K 4.2 08/30/2021 0406   CL 102 08/30/2021 0406   CO2 26 08/30/2021 0406   GLUCOSE 190 (H) 08/30/2021 0406   BUN 25 (H) 08/30/2021 0406   CREATININE 1.49 (H) 08/30/2021 0406   CALCIUM 8.4 (L) 08/30/2021 0406   PROT 6.5 08/05/2021 1639   ALBUMIN 3.9 08/05/2021 1639   AST 32 08/05/2021 1639   ALT 28 08/05/2021 1639   ALKPHOS 57 08/05/2021 1639   BILITOT 0.8 08/05/2021 1639   GFRNONAA 49 (L) 08/30/2021 0406     ASSESSMENT AND PLAN: 76 year old male with history of renal cell carcinoma with chief complaint of excessive bloating and gas.  He has identified beans and cruciferous vegetables as exacerbators of his symptoms and typically avoids them.  These foods are well known to increase intestinal gas formation.  The patient is on a robust acid suppression regimen despite denying typical GERD symptoms.  He thinks he is on these medications to treat the bloating.  As PPIs can often cause symptoms of bloating and diarrhea, I suggested we try to wean down his Prilosec and see how his symptoms.  Will decrease dose to 20 mg daily.  If he does not experience any worsening of his symptoms after a month, I would recommend he continued to wean to every other day for 2 weeks, then stop completely. The patient will be due for colon cancer screening next year.  Although he will be 50, he is very healthy and active and should be considered for ongoing  colon cancer screening.  Given his frequent hematochezia, he should not be given a stool-based screening test such as Cologuard  Bloating/gas - Decrease Prilosec to 20 mg PO daily; work to wean off completely, possible that PPI causing gas/bloating - Continue Pepcid - Check H. Pylori serology (stool antigen will be inaccurate on long term PPI) and TTG/IgA - Continue dietary modification (avoidance of legumes and cruciferous vegetables)  Colon cancer screening - Consider repeat screening colonoscopy in 2024  Gavin Hernandez E. Candis Schatz, MD [facility]   CC:  Glenis Smoker, *

## 2022-05-07 NOTE — Patient Instructions (Addendum)
If you are age 76 or older, your body mass index should be between 23-30. Your Body mass index is 27.47 kg/m. If this is out of the aforementioned range listed, please consider follow up with your Primary Care Provider.  If you are age 76 or younger, your body mass index should be between 19-25. Your Body mass index is 27.47 kg/m. If this is out of the aformentioned range listed, please consider follow up with your Primary Care Provider.   ________________________________________________________  The [facility] providers would like to encourage you to use [facility] to communicate with providers for non-urgent requests or questions.  Due to long hold times on the telephone, sending your provider a message by [facility] may be a faster and more efficient way to get a response.  Please allow 48 business hours for a response.  Please remember that this is for non-urgent requests.  _______________________________________________________   Your provider has requested that you go to the basement level for lab work before leaving today. Press "B" on the elevator. The lab is located at the first door on the left as you exit the elevator.   We have sent the following medications to your pharmacy for you to pick up at your convenience: Omeprazole 20 mg daily  Due to recent changes in healthcare laws, you may see the results of your imaging and laboratory studies on MyChart before your provider has had a chance to review them.  We understand that in some cases there may be results that are confusing or concerning to you. Not all laboratory results come back in the same time frame and the provider may be waiting for multiple results in order to interpret others.  Please give [place] 48 hours in order for your provider to thoroughly review all the results before contacting the office for clarification of your results.    Thank you for entrusting me with your care and choosing [facility].  Dr.Cunningham

## 2022-05-08 LAB — IGA: Immunoglobulin A: 109 mg/dL (ref 70–320)

## 2022-05-08 LAB — TISSUE TRANSGLUTAMINASE, IGA: (tTG) Ab, IgA: <1 U/mL

## 2022-05-11 NOTE — Progress Notes (Signed)
Gavin Hernandez,  Your tests for celiac disease and H. pylori infection were negative (normal).  I would continue the lower dose omeprazole.  If you have not experienced any worsening of symptoms on the lower dose after a few weeks, I would start taking the omeprazole every other day for 2 weeks and then discontinue omeprazole completely.  I am hopeful that some of your bloating, gas and loose stools may get better once you are off the omeprazole.

## 2022-05-20 ENCOUNTER — Encounter: Payer: Medicare Other | Attending: Physical Medicine & Rehabilitation | Admitting: Physical Medicine & Rehabilitation

## 2022-05-20 ENCOUNTER — Encounter: Payer: Self-pay | Admitting: Physical Medicine & Rehabilitation

## 2022-05-20 DIAGNOSIS — M545 Low back pain, unspecified: Secondary | ICD-10-CM | POA: Diagnosis present

## 2022-05-20 NOTE — Patient Instructions (Signed)
REGULAR STRETCHING CAN HELP YOUR PAIN. USE COMMON SENSE AND DON'T "OVER-DO" THINGS.  APPLY ICE TO RIGHT HIP AFTER A VIGOROUS ACTIVITY OR IF YOU HAVE PAIN   TYLENOL IS FINE AS A FIRST LINE FOR PAIN. YOU CAN USE UP TO '3000MG'$  PER DAY.  IF OK WITH YOUR PRIMARY MD, YOU CAN USE OCCASIONAL NAPROXEN FOR PAIN.

## 2022-05-20 NOTE — Progress Notes (Signed)
Subjective:    Patient ID: Gavin Hernandez, male    DOB: September 11, 1946, 76 y.o.   MRN: [id number]  HPI This is an initial office visit for this 76 year old white male with a history of renal cell carcinoma status post nephrectomy.  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 He also struggles with shoulder pain and pain in his right hip has been his main complaint. He was started on gabapentin by his primary per April office note.   He sees Dr. Dorna Leitz for his knees and has been going to [facility] for his knees for holisitic management this past month. He has noticed some positive results so far.  His right hip pain has been present for about 3 months. The pain came on insidiously and is along the outer portion of hip and is painful to touch. He has struggled to sleep on his right side. He does feel that the pain has already begun to improve on its own.   For pain he's only used tylenol. He hasn't used nsaid's due to his kidney situation. He has used salonpas as well as ice.    He had lumbar fusion surgery back into 1987 which has held up amazingly well.    He walks his dogs daily and stays active. 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   Pain Inventory Average Pain 3 Pain Right Now 3 My pain is intermittent, dull, and aching  In the last 24 hours, has pain interfered with the following? General activity 3 Relation with others 1 Enjoyment of life 3 What TIME of day is your pain at its worst? daytime Sleep (in general) Fair  Pain is worse with: walking and sitting Pain improves with: heat/ice Relief from Meds: 3  walk without assistance how many minutes can you walk? 60 or more ability to climb steps?  yes do you drive?  yes Do you have any goals in this area?  yes  retired Do you have any goals in this area?  yes  tremor trouble walking  Any  changes since last visit?  no  Any changes since last visit?  no    Family History  Problem Relation Age of Onset   Lung disease Mother    Other Father        MVA   Diabetes Sister    Heart Problems Brother        PACEMAKER   Colon cancer Neg Hx    Stomach cancer Neg Hx    Esophageal cancer Neg Hx    Colon polyps Neg Hx    Social History   Socioeconomic History   Marital status: Married    Spouse name: Not on file   Number of children: 0   Years of education: Not on file   Highest education level: Not on file  Occupational History   Occupation: Retired  Tobacco Use   Smoking status: Never   Smokeless tobacco: Never  Vaping Use   Vaping Use: Never used  Substance and Sexual Activity   Alcohol use: Yes    Comment: occ   Drug use: Never   Sexual activity: Not on file  Other Topics Concern   Not on file  Social History Narrative   Not on file   Social Determinants of Health   Financial Resource Strain: Not on file  Food Insecurity: Not on file  Transportation Needs: Not on file  Physical Activity: Not on file  Stress: Not on file  Social Connections: Not on file   Past Surgical History:  Procedure Laterality Date   HERNIA REPAIR     no mesh   KNEE ARTHROSCOPY Left    x2   LUMBAR FUSION     ROBOT ASSISTED LAPAROSCOPIC NEPHRECTOMY Right 08/29/2021   Procedure: XI ROBOTIC ASSISTED LAPAROSCOPIC RADICAL NEPHRECTOMY;  Surgeon: Ceasar Mons, MD;  Location: [facility];  Service: Urology;  Laterality: Right;  ONLY NEEDS 180 MIN   SHOULDER ARTHROSCOPY     thumb surgery     Past Medical History:  Diagnosis Date   AAA (abdominal aortic aneurysm) without rupture (HCC)    Abnormal echocardiogram    Aortic valve sclerosis    Arthritis    Atrial fibrillation (HCC)    Cancer (HCC)    skin & kidney   Cardiac murmur    Dyslipidemia    GERD (gastroesophageal reflux disease)    History of atrial fibrillation    History of kidney stones    Hypertension     Inguinal hernia    Irregular heart beat    Lipidemia    LVH (left ventricular hypertrophy)    MVP (mitral valve prolapse)    Non-rheumatic mitral regurgitation    Non-rheumatic mitral regurgitation    OSA (obstructive sleep apnea)    Palpitations    PVD (peripheral vascular disease) (HCC)    Renal disorder    Right renal mass    Secondary polycythemia    Skin cancer    Stage 2 chronic kidney disease    Tremor    BP 129/79   Pulse 64   Ht '5\' 9"'$  (1.753 m)   Wt 185 lb 3.2 oz (84 kg)   SpO2 96%   BMI 27.35 kg/m   Opioid Risk Score:   Fall Risk Score:  `1  Depression screen [facility] 2/9     05/20/2022   10:24 AM  Depression screen PHQ 2/9  Decreased Interest 0  Down, Depressed, Hopeless 0  PHQ - 2 Score 0  Altered sleeping 3  Tired, decreased energy 0  Change in appetite 0  Feeling bad or failure about yourself  0  Trouble concentrating 0  Moving slowly or fidgety/restless 0  Suicidal thoughts 0  PHQ-9 Score 3     Review of Systems  Constitutional: Negative.   HENT: Negative.    Eyes: Negative.   Respiratory: Negative.    Cardiovascular: Negative.   Gastrointestinal: Negative.   Endocrine: Negative.   Genitourinary: Negative.   Musculoskeletal:  Positive for gait problem.  Skin: Negative.   Allergic/Immunologic: Negative.   Neurological:  Positive for tremors.  Hematological: Negative.   Psychiatric/Behavioral: Negative.        Objective:   Physical Exam Gen: no distress, normal appearing HEENT: oral mucosa pink and moist, NCAT Cardio: Reg rate Chest: normal effort, normal rate of breathing Abd: soft, non-distended Ext: no edema Psych: pleasant, normal affect Skin: intact Neuro: Alert and oriented x 3. Normal insight and awareness. Intact Memory. Normal language and speech. Cranial nerve exam unremarkable. Motor exam normal. Sensory exam normal for light touch and pain in all 4 limbs. No limb ataxia or cerebellar signs. No abnormal tone appreciated.   Mild resting tremor Musculoskeletal: Low back with large surgical scar.  Has amazing range of motion and touches his toes easily.  Hamstrings are fairly loose.  His gluteal muscles and hip rotators are a  bit tight.  He has pain with palpation over the right greater trochanter area.  No significant pain with figure-of-four stretch.  Upper extremity range of motion is generally functional.  Standing posture is reasonable.  Gait is normal although he does seem to step down into the right side a bit.  I saw no obvious leg length discrepancy.  Seated slump test was negative.  Straight leg raising was negative.  Mild crepitus at the knees.  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 Plan:  1.  Symptoms are already improving.  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  We discussed some basic do's and don'ts today and extensive stretches were provided.  He is already sleeping on his back and his left side so that will help with his pain further. 2.  Discussed routine ice to the hip as needed for flares or after he has been involved in some especially vigorous activity 3.  He may use up to 3000 mg of Tylenol per day for mild pain 4.  Over-the-counter naproxen spine for more significant pain as long as his family doctor is okay with this.  He tells me that his renal function is normal despite the nephrectomy. 5. 30 minutes of face to face patient care time were spent during this visit. All questions were encouraged and answered.  Follow up with me as needed

## 2022-08-06 ENCOUNTER — Ambulatory Visit (INDEPENDENT_AMBULATORY_CARE_PROVIDER_SITE_OTHER): Payer: Medicare Other | Admitting: Podiatry

## 2022-08-06 DIAGNOSIS — D3610 Benign neoplasm of peripheral nerves and autonomic nervous system, unspecified: Secondary | ICD-10-CM

## 2022-08-06 DIAGNOSIS — B351 Tinea unguium: Secondary | ICD-10-CM | POA: Diagnosis not present

## 2022-08-06 MED ORDER — TRIAMCINOLONE ACETONIDE 10 MG/ML IJ SUSP
10.0000 mg | Freq: Once | INTRAMUSCULAR | Status: AC
  Administered 2022-08-06: 10 mg

## 2022-08-06 NOTE — Progress Notes (Signed)
Subjective: Chief Complaint  Patient presents with   Foot Pain    Patient came in today with left forefoot pain, rates the pain a 4 out of 10, pain state that the pain is worse after walking his dogs, ache, shooting pain, TX: pads, Injection (Did help the last visit and would like one today)     76 year old male presents the above concerns.  Symptoms started coming back last week and he tried a pad which helped but today it continues to worsens. It feels like it did before. No recent injuries or changes.   Was using penlac but thought he was only to use it for 1 week.  Did not know he had to go longer than this.  But when she removed it and cleaned it off she was good.  Objective: AAO x3, NAD DP/PT pulses palpable bilaterally, CRT less than 3 seconds Overall nails unchanged.  No edema, erythema or signs of infection. Tenderness palpation left second interspace and small palpable neuroma noted today.  No.  Pinpoint tenderness.  No pain to the MPJs.  No pain submetatarsal heads and it is along the interspace.  No pain with calf compression, swelling, warmth, erythema  Assessment: 76 year old male with right foot, onychomycosis  Plan: -All treatment options discussed with the patient including all alternatives, risks, complications.  -Injection performed the right second interspace.  Skin was cleaned with alcohol mixture 1 cc Kenalog 10, 0.5 cc of Marcaine plain, 0.5 cc of lidocaine plain was infiltrated into the second interspace without complications.  Postinjection care discussed.  Tolerated well. -Sharply debrided nails without any complications or bleeding as a courtesy.  Continue Penlac.  We discussed the treatment for this as well as duration. -Patient encouraged to call the office with any questions, concerns, change in symptoms.   Trula Slade DPM

## 2022-08-06 NOTE — Patient Instructions (Signed)

## 2022-12-31 ENCOUNTER — Other Ambulatory Visit: Payer: Self-pay | Admitting: Gastroenterology

## 2022-12-31 DIAGNOSIS — K219 Gastro-esophageal reflux disease without esophagitis: Secondary | ICD-10-CM

## 2023-01-25 ENCOUNTER — Other Ambulatory Visit (HOSPITAL_COMMUNITY): Payer: Self-pay | Admitting: Urology

## 2023-01-25 ENCOUNTER — Ambulatory Visit (HOSPITAL_COMMUNITY)
Admission: RE | Admit: 2023-01-25 | Discharge: 2023-01-25 | Disposition: A | Discharge status: Home / Self Care | Payer: Medicare Other | Source: Ambulatory Visit | Attending: Urology | Admitting: Urology

## 2023-01-25 DIAGNOSIS — C641 Malignant neoplasm of right kidney, except renal pelvis: Secondary | ICD-10-CM | POA: Insufficient documentation

## 2023-02-03 ENCOUNTER — Encounter: Payer: Self-pay | Admitting: Physician Assistant

## 2023-02-03 ENCOUNTER — Ambulatory Visit: Payer: Medicare Other | Attending: Physician Assistant | Admitting: Physician Assistant

## 2023-02-03 VITALS — BP 128/78 | HR 75 | Ht 69.0 in | Wt 184.8 lb

## 2023-02-03 DIAGNOSIS — I10 Essential (primary) hypertension: Secondary | ICD-10-CM

## 2023-02-03 DIAGNOSIS — I341 Nonrheumatic mitral (valve) prolapse: Secondary | ICD-10-CM

## 2023-02-03 DIAGNOSIS — E785 Hyperlipidemia, unspecified: Secondary | ICD-10-CM

## 2023-02-03 MED ORDER — METOPROLOL TARTRATE 25 MG PO TABS: 12.5000 mg | ORAL_TABLET | Freq: Two times a day (BID) | ORAL | 3 refills | Status: DC

## 2023-02-03 NOTE — Progress Notes (Signed)
Office Visit    Patient Name: Gavin Hernandez Date of Encounter: 02/03/2023  PCP:  Shon Hale, MD   [facility]  Cardiologist:  Orbie Pyo, MD  Advanced Practice Provider:  No care team member to display Electrophysiologist:  None   HPI    Gavin Hernandez is a 77 y.o. male with a past medical history significant for mitral valve prolapse, hypertension, and hyperlipidemia presents today for follow-up visit.  He was seen by Dr. Lynnette Caffey 10/31/2021 and was doing well at that time.  A baseline echocardiogram was ordered.  Blood pressure was well-controlled.  Echocardiogram 12/27/2021 showed mild MR but normal heart pump function.  Today, he tells me that  he has had a cough since October.  He had a lung workup which was negative.  Now he is seeing a GI doctor and plans for barium swallow on Monday.  I reviewed his cardiac medications today and none of them are effective of cough.  Otherwise, he is doing well from a CV standpoint.  Reports no shortness of breath nor dyspnea on exertion. Reports no chest pain, pressure, or tightness. No edema, orthopnea, PND. Reports no palpitations.   Past Medical History    Past Medical History:  Diagnosis Date   AAA (abdominal aortic aneurysm) without rupture    Abnormal echocardiogram    Aortic valve sclerosis    Arthritis    Atrial fibrillation    Cancer    skin & kidney   Cardiac murmur    Dyslipidemia    GERD (gastroesophageal reflux disease)    History of atrial fibrillation    History of kidney stones    Hypertension    Inguinal hernia    Irregular heart beat    Lipidemia    LVH (left ventricular hypertrophy)    MVP (mitral valve prolapse)    Non-rheumatic mitral regurgitation    Non-rheumatic mitral regurgitation    OSA (obstructive sleep apnea)    Palpitations    PVD (peripheral vascular disease)    Renal disorder    Right renal mass    Secondary polycythemia    Skin cancer    Stage 2  chronic kidney disease    Tremor    Past Surgical History:  Procedure Laterality Date   HERNIA REPAIR     no mesh   KNEE ARTHROSCOPY Left    x2   LUMBAR FUSION     ROBOT ASSISTED LAPAROSCOPIC NEPHRECTOMY Right 08/29/2021   Procedure: XI ROBOTIC ASSISTED LAPAROSCOPIC RADICAL NEPHRECTOMY;  Surgeon: Rene Paci, MD;  Location: [facility];  Service: Urology;  Laterality: Right;  ONLY NEEDS 180 MIN   SHOULDER ARTHROSCOPY     thumb surgery      Allergies  Allergies  Allergen Reactions   Bactrim [Sulfamethoxazole-Trimethoprim]    Lactose Intolerance (Gi) Diarrhea   Sulfa Antibiotics Hives and Itching     EKGs/Labs/Other Studies Reviewed:   The following studies were reviewed today:  Echocardiogram 12/26/2021  IMPRESSIONS     1. Mildly elevated LVOT velocity of 2.2 m/s.   2. Left ventricular ejection fraction, by estimation, is 60 to 65%. The  left ventricle has normal function. The left ventricle has no regional  wall motion abnormalities. The left ventricular internal cavity size was  mildly dilated. Left ventricular  diastolic parameters are consistent with Grade I diastolic dysfunction  (impaired relaxation).   3. Right ventricular systolic function is normal. The right ventricular  size is normal.  4. Left atrial size was mildly dilated.   5. The mitral valve is normal in structure. Trivial mitral valve  regurgitation. No evidence of mitral stenosis.   6. The aortic valve is tricuspid. Aortic valve regurgitation is not  visualized. No aortic stenosis is present.   7. The inferior vena cava is normal in size with greater than 50%  respiratory variability, suggesting right atrial pressure of 3 mmHg.   Comparison(s): No prior Echocardiogram.   FINDINGS   Left Ventricle: Left ventricular ejection fraction, by estimation, is 60  to 65%. The left ventricle has normal function. The left ventricle has no  regional wall motion abnormalities. The left ventricular  internal cavity  size was mildly dilated. There is   no left ventricular hypertrophy. Left ventricular diastolic parameters  are consistent with Grade I diastolic dysfunction (impaired relaxation).   Right Ventricle: The right ventricular size is normal. Right ventricular  systolic function is normal.   Left Atrium: Left atrial size was mildly dilated.   Right Atrium: Right atrial size was normal in size.   Pericardium: There is no evidence of pericardial effusion.   Mitral Valve: The mitral valve is normal in structure. Trivial mitral  valve regurgitation. No evidence of mitral valve stenosis.   Tricuspid Valve: The tricuspid valve is normal in structure. Tricuspid  valve regurgitation is mild . No evidence of tricuspid stenosis.   Aortic Valve: The aortic valve is tricuspid. Aortic valve regurgitation is  not visualized. No aortic stenosis is present. Aortic valve mean gradient  measures 9.3 mmHg. Aortic valve peak gradient measures 19.2 mmHg.   Pulmonic Valve: The pulmonic valve was normal in structure. Pulmonic valve  regurgitation is not visualized. No evidence of pulmonic stenosis.   Aorta: The aortic root is normal in size and structure.   Venous: The inferior vena cava is normal in size with greater than 50%  respiratory variability, suggesting right atrial pressure of 3 mmHg.   IAS/Shunts: No atrial level shunt detected by color flow Doppler.   Additional Comments: Mildly elevated LVOT velocity of 2.2 m/s.       EKG:  EKG is  ordered today.  The ekg ordered today demonstrates normal sinus rhythm, rate 64 bpm with occasional PVC  Recent Labs: 03/31/2022: TSH 1.220  Recent Lipid Panel No results found for: "CHOL", "TRIG", "HDL", "CHOLHDL", "VLDL", "LDLCALC", "LDLDIRECT"   Home Medications   Current Meds  Medication Sig   atorvastatin (LIPITOR) 20 MG tablet Take 20 mg by mouth every evening.   BABY ASPIRIN PO Take by mouth.   Coenzyme Q10 (CO Q 10) 100 MG CAPS  Take 1 tablet by mouth daily.   famotidine (PEPCID) 40 MG tablet Take 40 mg by mouth daily.   Fish Oil-Cholecalciferol (OMEGA-3 + D PO) Take by mouth.   losartan (COZAAR) 25 MG tablet Take 25 mg by mouth in the morning.   metoprolol tartrate (LOPRESSOR) 25 MG tablet Take 0.5 tablets (12.5 mg total) by mouth 2 (two) times daily.   Misc Natural Products (OSTEO BI-FLEX JOINT SHIELD PO) Take 2 tablets by mouth daily.   Multiple Vitamins-Minerals (MULTI FOR HIM 50+) TABS Take 1 tablet by mouth daily.   omeprazole (PRILOSEC) 20 MG capsule Take 1 capsule (20 mg total) by mouth daily.   RESTASIS 0.05 % ophthalmic emulsion Place 1 drop into both eyes 2 (two) times daily.   Saw Palmetto 450 MG CAPS Take 1 tablet by mouth in the morning and at bedtime.  Review of Systems      All other systems reviewed and are otherwise negative except as noted above.  Physical Exam    VS:  BP 128/78   Pulse 75   Ht 5\' 9"  (1.753 m)   Wt 184 lb 12.8 oz (83.8 kg)   SpO2 99%   BMI 27.29 kg/m  , BMI Body mass index is 27.29 kg/m.  Wt Readings from Last 3 Encounters:  02/03/23 184 lb 12.8 oz (83.8 kg)  05/20/22 185 lb 3.2 oz (84 kg)  05/07/22 186 lb (84.4 kg)     GEN: Well nourished, well developed, in no acute distress. HEENT: normal. Neck: Supple, no JVD, carotid bruits, or masses. Cardiac: RRR with skipped beat, no murmurs, rubs, or gallops. No clubbing, cyanosis, edema.  Radials/PT 2+ and equal bilaterally.  Respiratory:  Respirations regular and unlabored, clear to auscultation bilaterally. GI: Soft, nontender, nondistended. MS: No deformity or atrophy. Skin: Warm and dry, no rash. Neuro:  Strength and sensation are intact. Psych: Normal affect.  Assessment & Plan    Frequent PVCs -Start metoprolol 12.5mg  BID -continue to monitor closely   Mitral valve prolapse -2025 echo to re-evaluate -5 walks a day with his dog -asymptomatic at this time  Hyperlipidemia -72, HDL 22, total  cholesterol 130, triglycerides 80 -Continue Lipitor 20 mg daily -Will be due for follow-up lipid panel in the fall with PCP  Hypertension -BP well controlled today -Continue current medications including losartan 25 mg daily and add on metoprolol 12.5 mg twice a day         Disposition: Follow up 1 year with Orbie Pyo, MD or APP.  Signed, Sharlene Dory, PA-C 02/03/2023, 5:02 PM [facility]

## 2023-02-03 NOTE — Patient Instructions (Signed)
Medication Instructions:  Start metoprolol tartate (Lopressor) 12.5 mg twice a day (this will be 1/2 of a 25 mg tablet) *If you need a refill on your cardiac medications before your next appointment, please call your pharmacy*  Lab Work: None ordered If you have labs (blood work) drawn today and your tests are completely normal, you will receive your results only by: MyChart Message (if you have MyChart) OR A paper copy in the mail If you have any lab test that is abnormal or we need to change your treatment, we will call you to review the results.  Follow-Up: At [facility], you and your health needs are our priority.  As part of our continuing mission to provide you with exceptional heart care, we have created designated Provider Care Teams.  These Care Teams include your primary Cardiologist (physician) and Advanced Practice Providers (APPs -  Physician Assistants and Nurse Practitioners) who all work together to provide you with the care you need, when you need it.  Your next appointment:   1 year(s)  Provider:   Orbie Pyo, MD    Low-Sodium Eating Plan Sodium, which is an element that makes up salt, helps you maintain a healthy balance of fluids in your body. Too much sodium can increase your blood pressure and cause fluid and waste to be held in your body. Your health care provider or dietitian may recommend following this plan if you have high blood pressure (hypertension), kidney disease, liver disease, or heart failure. Eating less sodium can help lower your blood pressure, reduce swelling, and protect your heart, liver, and kidneys. What are tips for following this plan? Reading food labels The Nutrition Facts label lists the amount of sodium in one serving of the food. If you eat more than one serving, you must multiply the listed amount of sodium by the number of servings. Choose foods with less than 140 mg of sodium per serving. Avoid foods with 300 mg of sodium  or more per serving. Shopping  Look for lower-sodium products, often labeled as "low-sodium" or "no salt added." Always check the sodium content, even if foods are labeled as "unsalted" or "no salt added." Buy fresh foods. Avoid canned foods and pre-made or frozen meals. Avoid canned, cured, or processed meats. Buy breads that have less than 80 mg of sodium per slice. Cooking  Eat more home-cooked food and less restaurant, buffet, and fast food. Avoid adding salt when cooking. Use salt-free seasonings or herbs instead of table salt or sea salt. Check with your health care provider or pharmacist before using salt substitutes. Cook with plant-based oils, such as canola, sunflower, or olive oil. Meal planning When eating at a restaurant, ask that your food be prepared with less salt or no salt, if possible. Avoid dishes labeled as brined, pickled, cured, smoked, or made with soy sauce, miso, or teriyaki sauce. Avoid foods that contain MSG (monosodium glutamate). MSG is sometimes added to [place] food, bouillon, and some canned foods. Make meals that can be grilled, baked, poached, roasted, or steamed. These are generally made with less sodium. General information Most people on this plan should limit their sodium intake to 1,500-2,000 mg (milligrams) of sodium each day. What foods should I eat? Fruits Fresh, frozen, or canned fruit. Fruit juice. Vegetables Fresh or frozen vegetables. "No salt added" canned vegetables. "No salt added" tomato sauce and paste. Low-sodium or reduced-sodium tomato and vegetable juice. Grains Low-sodium cereals, including oats, puffed wheat and rice, and shredded  wheat. Low-sodium crackers. Unsalted rice. Unsalted pasta. Low-sodium bread. Whole-grain breads and whole-grain pasta. Meats and other proteins Fresh or frozen (no salt added) meat, poultry, seafood, and fish. Low-sodium canned tuna and salmon. Unsalted nuts. Dried peas, beans, and lentils without added  salt. Unsalted canned beans. Eggs. Unsalted nut butters. Dairy Milk. Soy milk. Cheese that is naturally low in sodium, such as ricotta cheese, fresh mozzarella, or Swiss cheese. Low-sodium or reduced-sodium cheese. Cream cheese. Yogurt. Seasonings and condiments Fresh and dried herbs and spices. Salt-free seasonings. Low-sodium mustard and ketchup. Sodium-free salad dressing. Sodium-free light mayonnaise. Fresh or refrigerated horseradish. Lemon juice. Vinegar. Other foods Homemade, reduced-sodium, or low-sodium soups. Unsalted popcorn and pretzels. Low-salt or salt-free chips. The items listed above may not be a complete list of foods and beverages you can eat. Contact a dietitian for more information. What foods should I avoid? Vegetables Sauerkraut, pickled vegetables, and relishes. Olives. [place] fries. Onion rings. Regular canned vegetables (not low-sodium or reduced-sodium). Regular canned tomato sauce and paste (not low-sodium or reduced-sodium). Regular tomato and vegetable juice (not low-sodium or reduced-sodium). Frozen vegetables in sauces. Grains Instant hot cereals. Bread stuffing, pancake, and biscuit mixes. Croutons. Seasoned rice or pasta mixes. Noodle soup cups. Boxed or frozen macaroni and cheese. Regular salted crackers. Self-rising flour. Meats and other proteins Meat or fish that is salted, canned, smoked, spiced, or pickled. Precooked or cured meat, such as sausages or meat loaves. Tomasa Blase. Ham. Pepperoni. Hot dogs. Corned beef. Chipped beef. Salt pork. Jerky. Pickled herring. Anchovies and sardines. Regular canned tuna. Salted nuts. Dairy Processed cheese and cheese spreads. Hard cheeses. Cheese curds. Blue cheese. Feta cheese. String cheese. Regular cottage cheese. Buttermilk. Canned milk. Fats and oils Salted butter. Regular margarine. Ghee. Bacon fat. Seasonings and condiments Onion salt, garlic salt, seasoned salt, table salt, and sea salt. Canned and packaged gravies.  Worcestershire sauce. Tartar sauce. Barbecue sauce. Teriyaki sauce. Soy sauce, including reduced-sodium. Steak sauce. Fish sauce. Oyster sauce. Cocktail sauce. Horseradish that you find on the shelf. Regular ketchup and mustard. Meat flavorings and tenderizers. Bouillon cubes. Hot sauce. Pre-made or packaged marinades. Pre-made or packaged taco seasonings. Relishes. Regular salad dressings. Salsa. Other foods Salted popcorn and pretzels. Corn chips and puffs. Potato and tortilla chips. Canned or dried soups. Pizza. Frozen entrees and pot pies. The items listed above may not be a complete list of foods and beverages you should avoid. Contact a dietitian for more information. Summary Eating less sodium can help lower your blood pressure, reduce swelling, and protect your heart, liver, and kidneys. Most people on this plan should limit their sodium intake to 1,500-2,000 mg (milligrams) of sodium each day. Canned, boxed, and frozen foods are high in sodium. Restaurant foods, fast foods, and pizza are also very high in sodium. You also get sodium by adding salt to food. Try to cook at home, eat more fresh fruits and vegetables, and eat less fast food and canned, processed, or prepared foods. This information is not intended to replace advice given to you by your health care provider. Make sure you discuss any questions you have with your health care provider. Document Revised: 09/18/2019 Document Reviewed: 09/13/2019 Elsevier Patient Education  2023 [employer]vier Inc.  Heart-Healthy Eating Plan Many factors influence your heart health, including eating and exercise habits. Heart health is also called coronary health. Coronary risk increases with abnormal blood fat (lipid) levels. A heart-healthy eating plan includes limiting unhealthy fats, increasing healthy fats, limiting salt (sodium) intake, and making other diet  and lifestyle changes. What is my plan? Your health care provider may recommend that: You  limit your fat intake to _________% or less of your total calories each day. You limit your saturated fat intake to _________% or less of your total calories each day. You limit the amount of cholesterol in your diet to less than _________ mg per day. You limit the amount of sodium in your diet to less than _________ mg per day. What are tips for following this plan? Cooking Cook foods using methods other than frying. Baking, boiling, grilling, and broiling are all good options. Other ways to reduce fat include: Removing the skin from poultry. Removing all visible fats from meats. Steaming vegetables in water or broth. Meal planning  At meals, imagine dividing your plate into fourths: Fill one-half of your plate with vegetables and green salads. Fill one-fourth of your plate with whole grains. Fill one-fourth of your plate with lean protein foods. Eat 2-4 cups of vegetables per day. One cup of vegetables equals 1 cup (91 g) broccoli or cauliflower florets, 2 medium carrots, 1 large bell pepper, 1 large sweet potato, 1 large tomato, 1 medium white potato, 2 cups (150 g) raw leafy greens. Eat 1-2 cups of fruit per day. One cup of fruit equals 1 small apple, 1 large banana, 1 cup (237 g) mixed fruit, 1 large orange,  cup (82 g) dried fruit, 1 cup (240 mL) 100% fruit juice. Eat more foods that contain soluble fiber. Examples include apples, broccoli, carrots, beans, peas, and barley. Aim to get 25-30 g of fiber per day. Increase your consumption of legumes, nuts, and seeds to 4-5 servings per week. One serving of dried beans or legumes equals  cup (90 g) cooked, 1 serving of nuts is  oz (12 almonds, 24 pistachios, or 7 walnut halves), and 1 serving of seeds equals  oz (8 g). Fats Choose healthy fats more often. Choose monounsaturated and polyunsaturated fats, such as olive and canola oils, avocado oil, flaxseeds, walnuts, almonds, and seeds. Eat more omega-3 fats. Choose salmon, mackerel,  sardines, tuna, flaxseed oil, and ground flaxseeds. Aim to eat fish at least 2 times each week. Check food labels carefully to identify foods with trans fats or high amounts of saturated fat. Limit saturated fats. These are found in animal products, such as meats, butter, and cream. Plant sources of saturated fats include palm oil, palm kernel oil, and coconut oil. Avoid foods with partially hydrogenated oils in them. These contain trans fats. Examples are stick margarine, some tub margarines, cookies, crackers, and other baked goods. Avoid fried foods. General information Eat more home-cooked food and less restaurant, buffet, and fast food. Limit or avoid alcohol. Limit foods that are high in added sugar and simple starches such as foods made using white refined flour (white breads, pastries, sweets). Lose weight if you are overweight. Losing just 5-10% of your body weight can help your overall health and prevent diseases such as diabetes and heart disease. Monitor your sodium intake, especially if you have high blood pressure. Talk with your health care provider about your sodium intake. Try to incorporate more vegetarian meals weekly. What foods should I eat? Fruits All fresh, canned (in natural juice), or frozen fruits. Vegetables Fresh or frozen vegetables (raw, steamed, roasted, or grilled). Green salads. Grains Most grains. Choose whole wheat and whole grains most of the time. Rice and pasta, including brown rice and pastas made with whole wheat. Meats and other proteins Lean, well-trimmed beef,  veal, pork, and lamb. Chicken and [place] without skin. All fish and shellfish. Wild duck, rabbit, pheasant, and venison. Egg whites or low-cholesterol egg substitutes. Dried beans, peas, lentils, and tofu. Seeds and most nuts. Dairy Low-fat or nonfat cheeses, including ricotta and mozzarella. Skim or 1% milk (liquid, powdered, or evaporated). Buttermilk made with low-fat milk. Nonfat or low-fat  yogurt. Fats and oils Non-hydrogenated (trans-free) margarines. Vegetable oils, including soybean, sesame, sunflower, olive, avocado, peanut, safflower, corn, canola, and cottonseed. Salad dressings or mayonnaise made with a vegetable oil. Beverages Water (mineral or sparkling). Coffee and tea. Unsweetened ice tea. Diet beverages. Sweets and desserts Sherbet, gelatin, and fruit ice. Small amounts of dark chocolate. Limit all sweets and desserts. Seasonings and condiments All seasonings and condiments. The items listed above may not be a complete list of foods and beverages you can eat. Contact a dietitian for more options. What foods should I avoid? Fruits Canned fruit in heavy syrup. Fruit in cream or butter sauce. Fried fruit. Limit coconut. Vegetables Vegetables cooked in cheese, cream, or butter sauce. Fried vegetables. Grains Breads made with saturated or trans fats, oils, or whole milk. Croissants. Sweet rolls. Donuts. High-fat crackers, such as cheese crackers and chips. Meats and other proteins Fatty meats, such as hot dogs, ribs, sausage, bacon, rib-eye roast or steak. High-fat deli meats, such as salami and bologna. Caviar. Domestic duck and goose. Organ meats, such as liver. Dairy Cream, sour cream, cream cheese, and creamed cottage cheese. Whole-milk cheeses. Whole or 2% milk (liquid, evaporated, or condensed). Whole buttermilk. Cream sauce or high-fat cheese sauce. Whole-milk yogurt. Fats and oils Meat fat, or shortening. Cocoa butter, hydrogenated oils, palm oil, coconut oil, palm kernel oil. Solid fats and shortenings, including bacon fat, salt pork, lard, and butter. Nondairy cream substitutes. Salad dressings with cheese or sour cream. Beverages Regular sodas and any drinks with added sugar. Sweets and desserts Frosting. Pudding. Cookies. Cakes. Pies. Milk chocolate or white chocolate. Buttered syrups. Full-fat ice cream or ice cream drinks. The items listed above may not  be a complete list of foods and beverages to avoid. Contact a dietitian for more information. Summary Heart-healthy meal planning includes limiting unhealthy fats, increasing healthy fats, limiting salt (sodium) intake and making other diet and lifestyle changes. Lose weight if you are overweight. Losing just 5-10% of your body weight can help your overall health and prevent diseases such as diabetes and heart disease. Focus on eating a balance of foods, including fruits and vegetables, low-fat or nonfat dairy, lean protein, nuts and legumes, whole grains, and heart-healthy oils and fats. This information is not intended to replace advice given to you by your health care provider. Make sure you discuss any questions you have with your health care provider. Document Revised: 11/17/2021 Document Reviewed: 11/17/2021 Elsevier Patient Education  2023 [employer]

## 2023-02-08 ENCOUNTER — Ambulatory Visit
Admission: RE | Admit: 2023-02-08 | Discharge: 2023-02-08 | Disposition: A | Discharge status: Home / Self Care | Payer: Medicare Other | Source: Ambulatory Visit | Attending: Gastroenterology | Admitting: Gastroenterology

## 2023-02-08 DIAGNOSIS — K219 Gastro-esophageal reflux disease without esophagitis: Secondary | ICD-10-CM

## 2023-04-27 ENCOUNTER — Other Ambulatory Visit: Payer: Self-pay

## 2023-04-27 MED ORDER — METOPROLOL TARTRATE 25 MG PO TABS: 12.5000 mg | ORAL_TABLET | Freq: Two times a day (BID) | ORAL | 2 refills | Status: DC

## 2023-04-27 NOTE — Telephone Encounter (Signed)
Pt's medication was sent to pt's pharmacy as requested. Confirmation received.  °

## 2023-05-26 IMAGING — CR DG CHEST 2V
2 series · 2 of 2 positions shown · non-contrast
Comparison: None.

CLINICAL DATA: 74-year-old male with a history of kidney cancer

EXAM:
CHEST - 2 VIEW

[w chest pa]
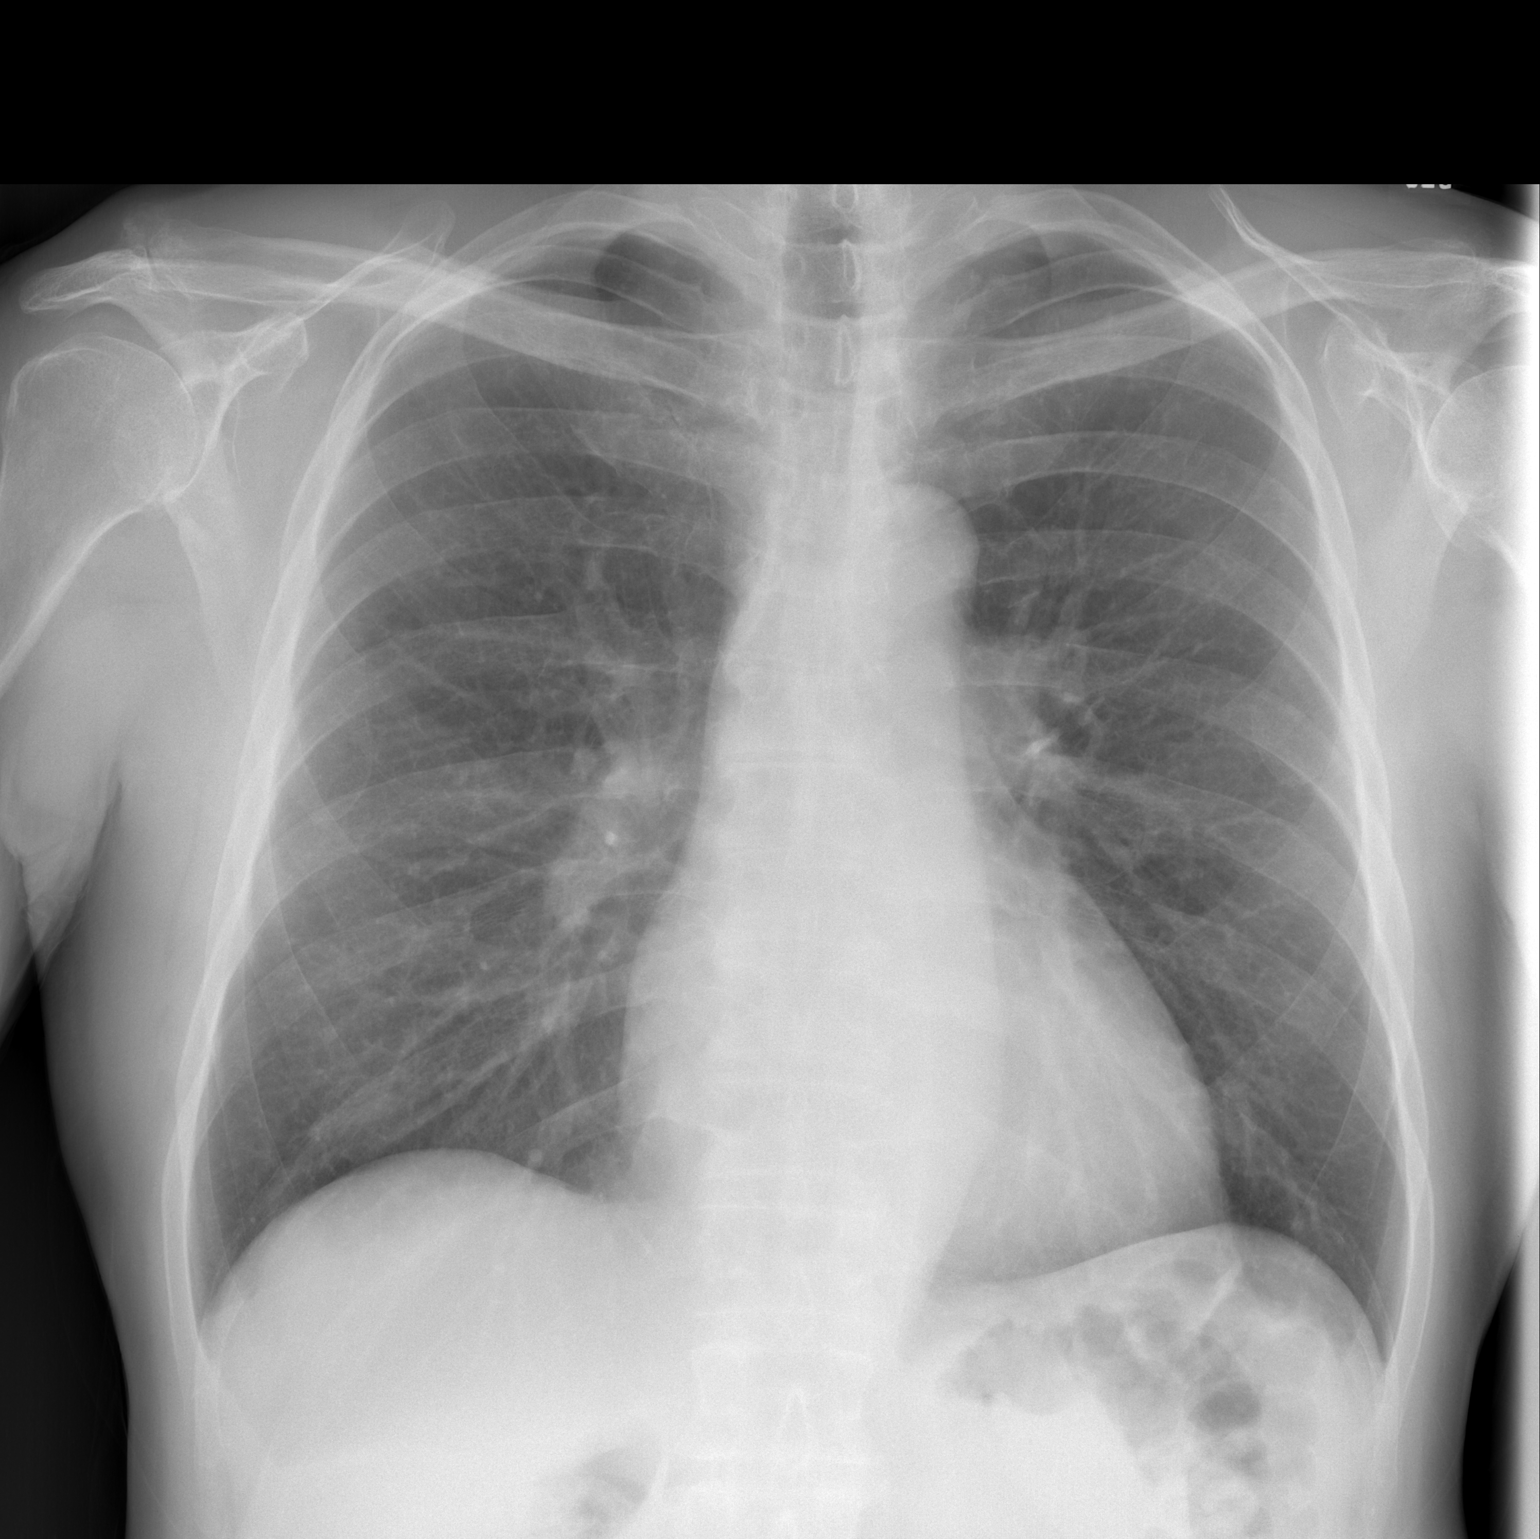

[w chest lat]
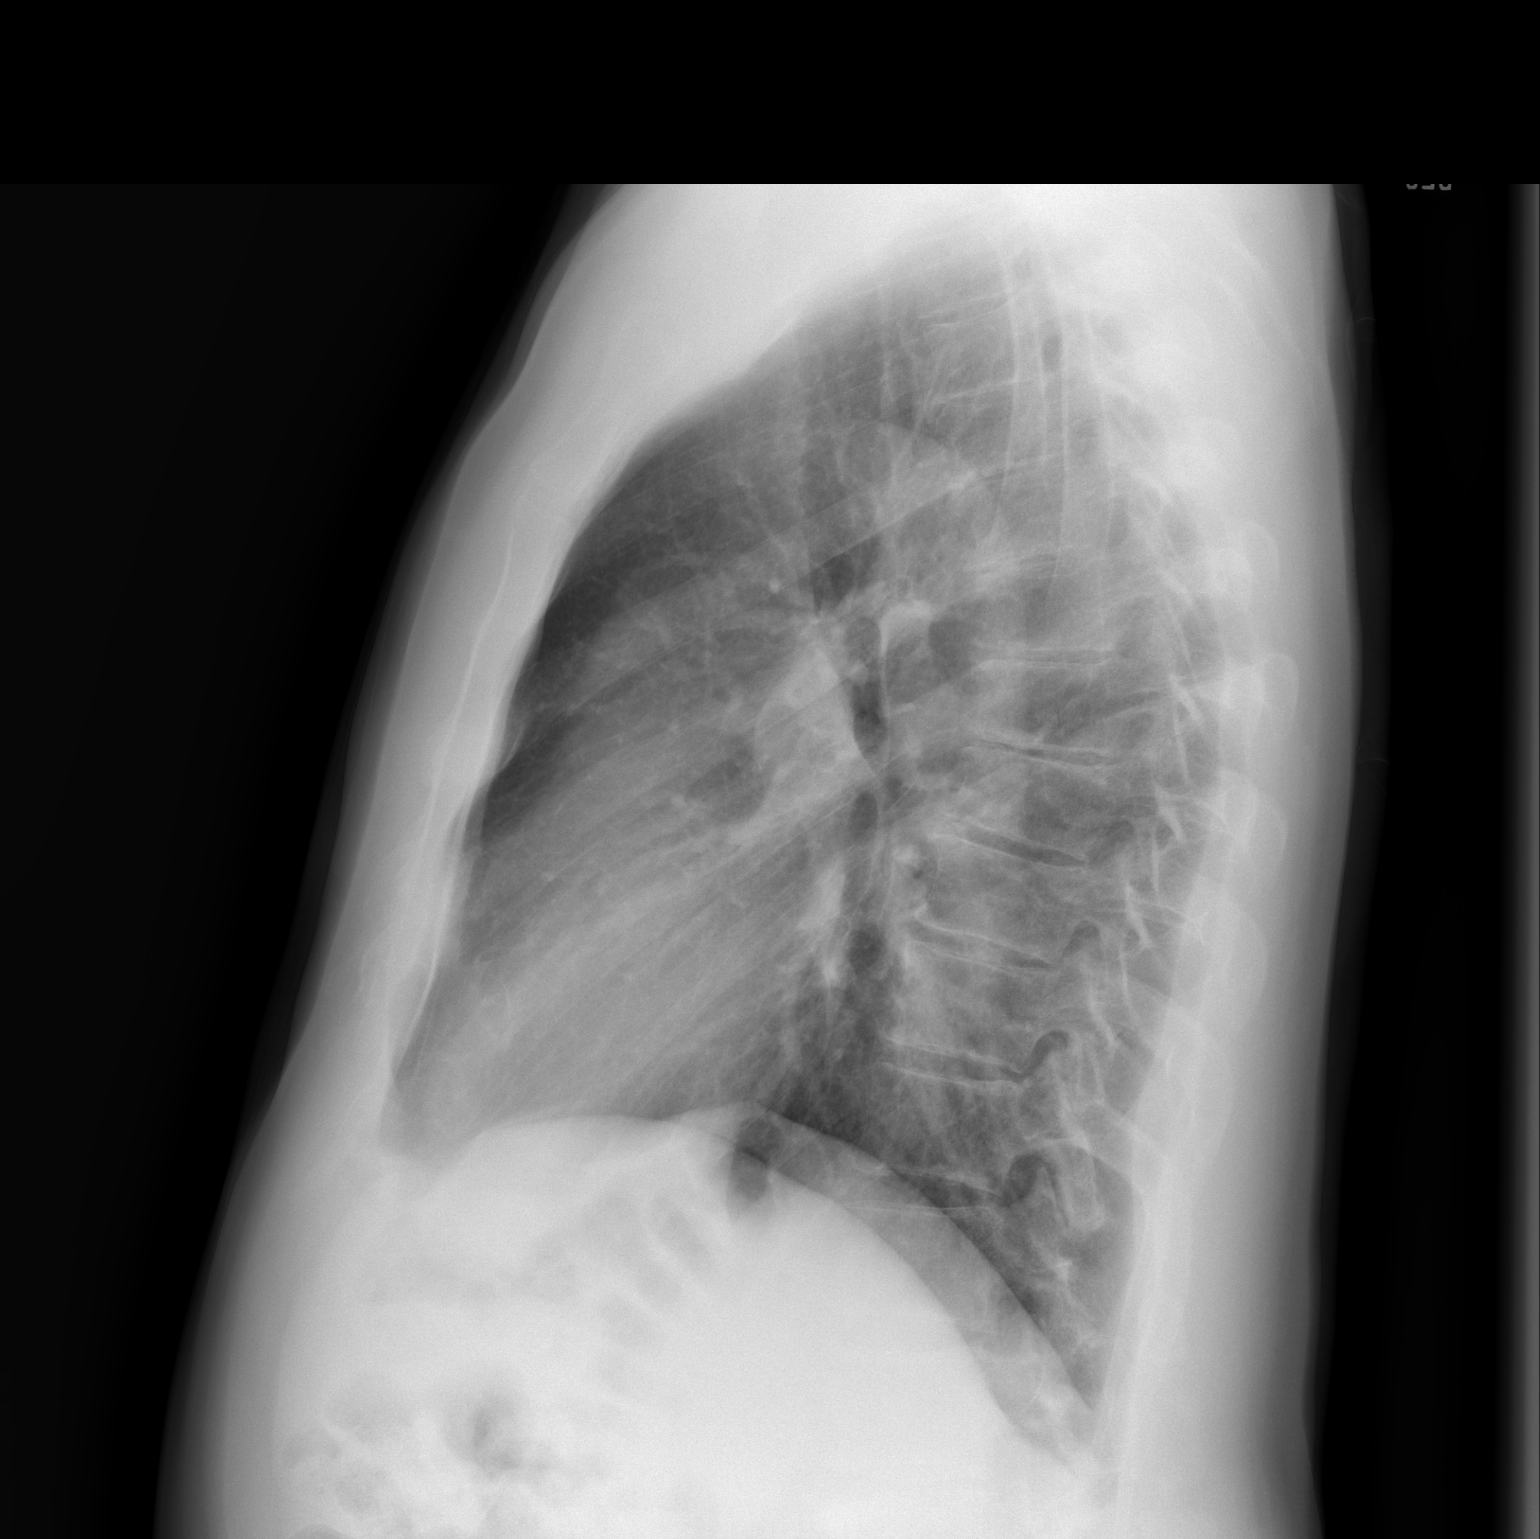

[2 of 2 positions shown; findings below may reference images not displayed]

FINDINGS: Cardiomediastinal silhouette within normal limits in size and
contour. No evidence of central vascular congestion. No interlobular
septal thickening.

No pneumothorax or pleural effusion. Coarsened interstitial
markings, with no confluent airspace disease.

No acute displaced fracture. Degenerative changes of the spine.
IMPRESSION: No active cardiopulmonary disease.

## 2023-06-08 IMAGING — MR MR ABDOMEN WO/W CM
11 of 17 series · 30 of 48 positions shown · IV contrast (17 ML MULTIHANCE)
Comparison: 07/29/2021 abdominopelvic CT

CLINICAL DATA: Right renal mass on CT.  Recent back pain.

EXAM:
MRI ABDOMEN WITHOUT AND WITH CONTRAST
TECHNIQUE: Multiplanar multisequence MR imaging of the abdomen was performed
both before and after the administration of intravenous contrast.
CONTRAST:  17mL MULTIHANCE GADOBENATE DIMEGLUMINE 529 MG/ML IV SOLN

[Series 4: cor haste · coronal · 5.0mm · 0.68mm/px · 2 of 32 slices shown]
[im 1/32]
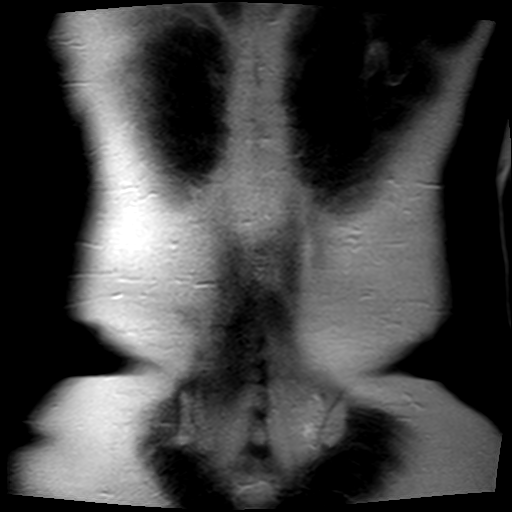
[im 32/32]
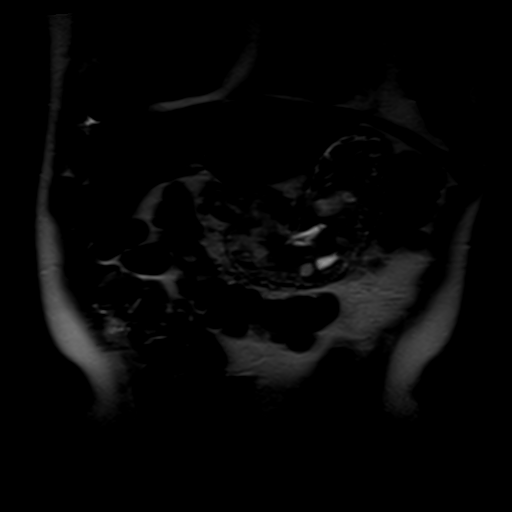

[Series 5: axial haste · axial · 6.0mm · 0.72mm/px · z∈[-133,+91]mm · 2 of 35 slices shown]
[im 1/35]
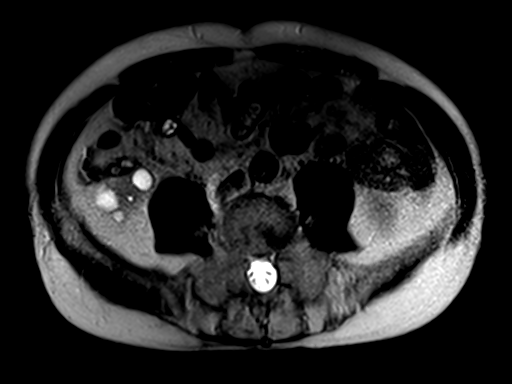
[im 35/35]
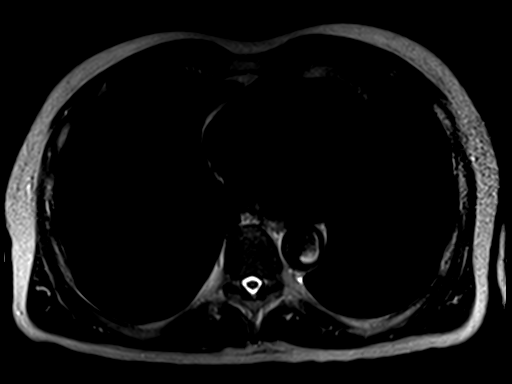

[Series 6: T1 · axial · 6.0mm · 0.72mm/px · z∈[-154,+84]mm · 4 of 74 slices shown]
[im 1/74]
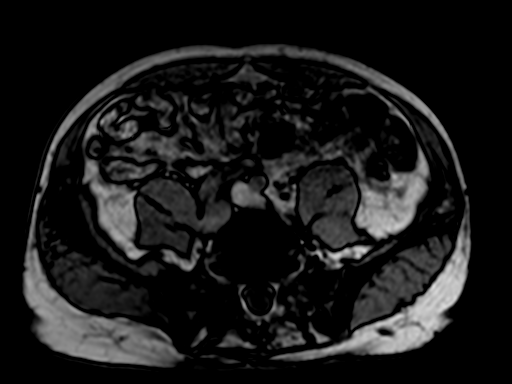
[im 25/74]
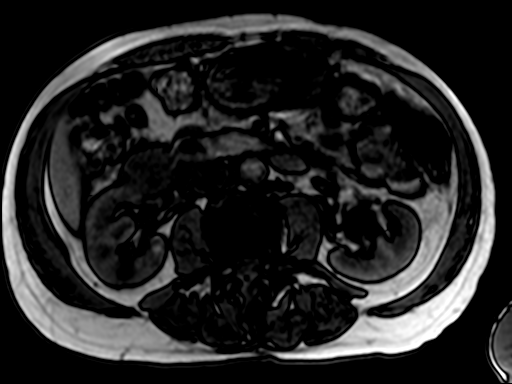
[im 49/74]
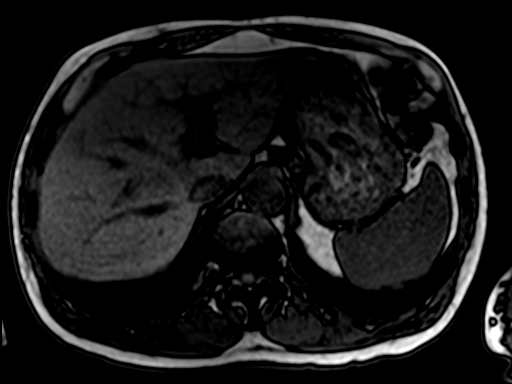
[im 74/74]
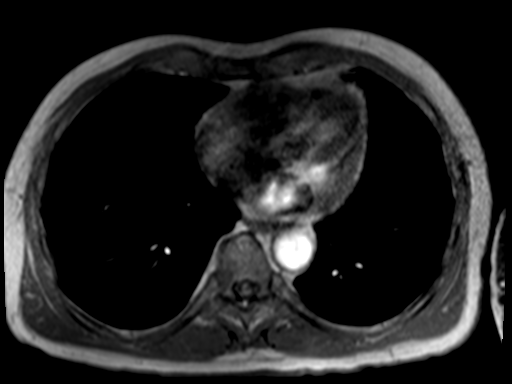

[Series 7: bSSFP · axial · 4.0mm · 0.72mm/px · z∈[-155,+85]mm · 3 of 61 slices shown]
[im 1/61]
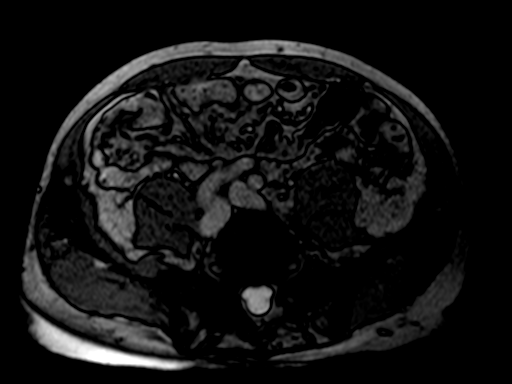
[im 31/61]
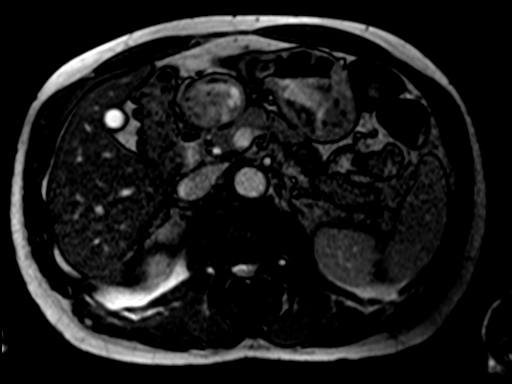
[im 61/61]
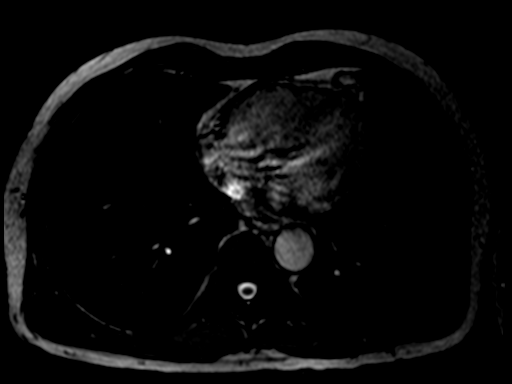

[Series 8: T2 fat-sat · axial · 6.0mm · 1.16mm/px · z∈[-109,+143]mm · 2 of 36 slices shown]
[im 1/36]
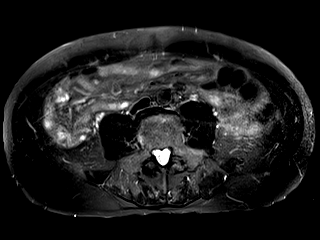
[im 36/36]
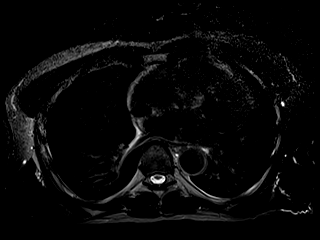

[Series 9: [id number]_trig · axial · 6.0mm · 1.93mm/px · z∈[-109,+143]mm · 4 of 108 slices shown]
[im 1/108]
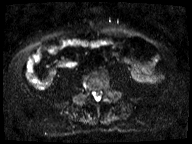
[im 36/108]
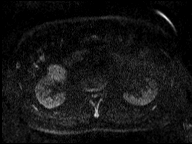
[im 72/108]
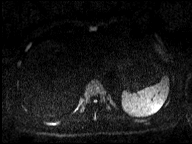
[im 108/108]
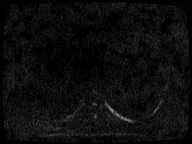

[Series 10: [id number] · axial · 6.0mm · 1.93mm/px · 1 of 36 slices shown]
[im 1/36]
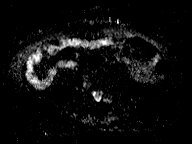

[Series 12: T1 dynamic · axial · non-contrast · 2.5mm · 0.74mm/px · z∈[-150,+87]mm · 3 of 96 slices shown]
[im 1/96]
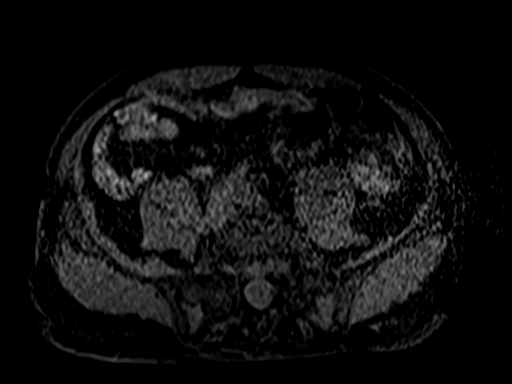
[im 48/96]
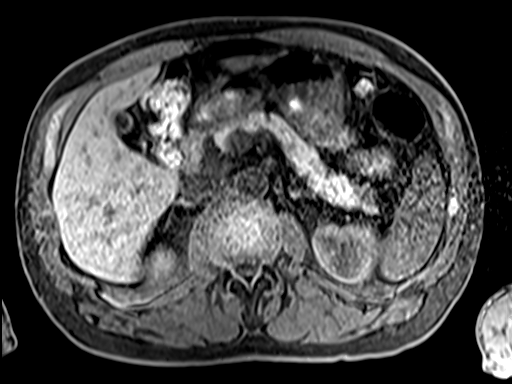
[im 96/96]
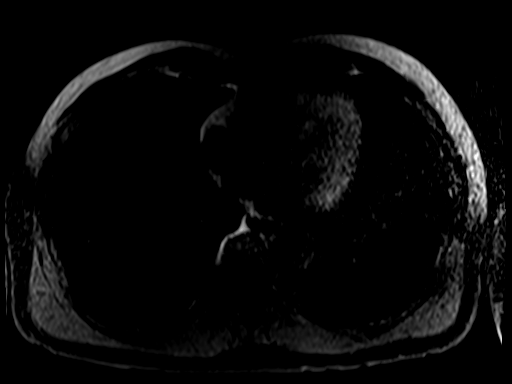

[Series 13: T1 dynamic post-contrast · axial · 2.5mm · 0.74mm/px · z∈[-150,+87]mm · 3 of 96 slices shown (1 of 3)]
[im 1/96]
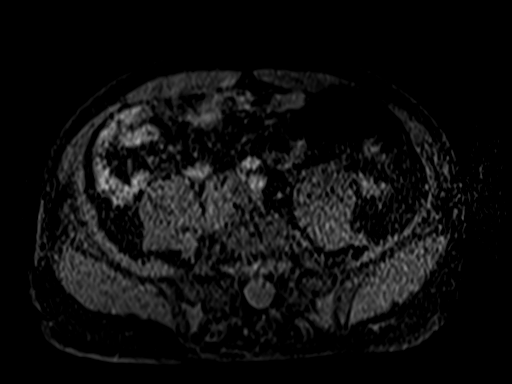
[im 48/96]
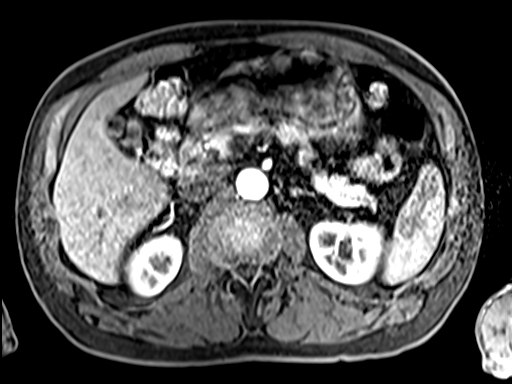
[im 96/96]
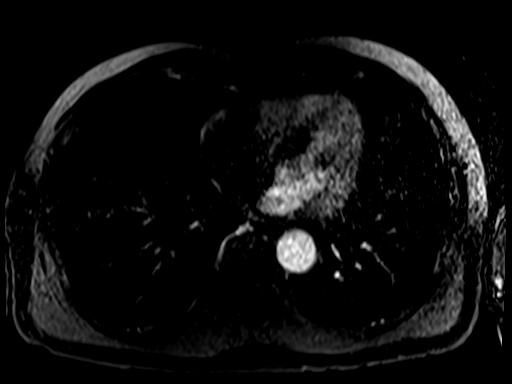

[Series 14: T1 dynamic post-contrast · axial · 2.5mm · 0.74mm/px · z∈[-150,+87]mm · 3 of 96 slices shown (2 of 3)]
[im 1/96]
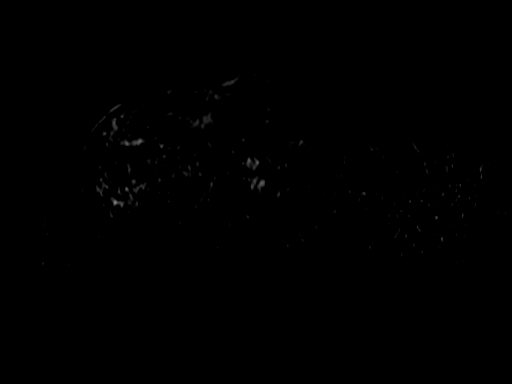
[im 48/96]
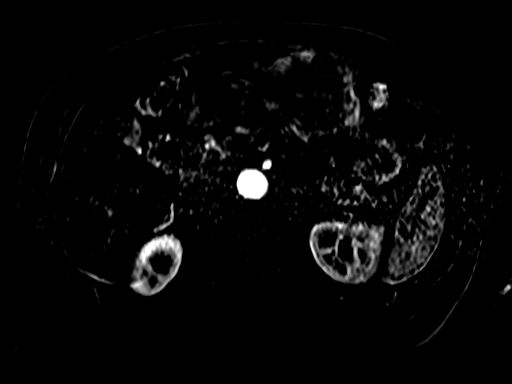
[im 96/96]
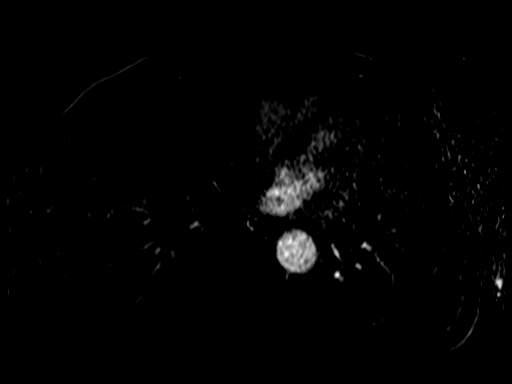

[Series 15: T1 dynamic post-contrast · axial · 2.5mm · 0.74mm/px · z∈[-150,+87]mm · 3 of 96 slices shown (3 of 3)]
[im 1/96]
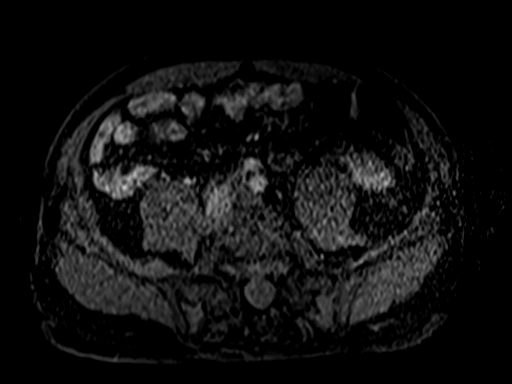
[im 48/96]
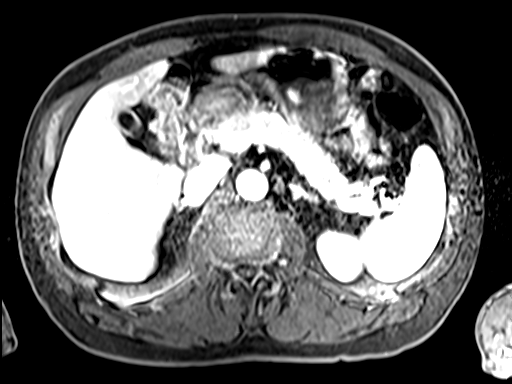
[im 96/96]
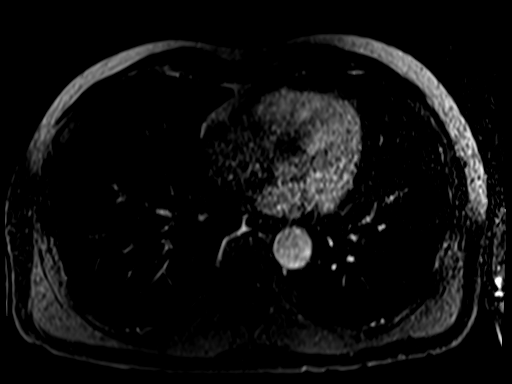

[30 of 48 positions shown; findings below may reference images not displayed]

FINDINGS: Lower chest: Normal heart size without pericardial or pleural
effusion.

Hepatobiliary: Normal liver. Normal gallbladder, without biliary
ductal dilatation.

Pancreas:  Normal, without mass or ductal dilatation.

Spleen:  Normal in size, without focal abnormality.

Adrenals/Urinary Tract: Normal adrenal glands. Anterior interpolar
exophytic right renal mass demonstrates heterogeneous precontrast T2
signal and avid postcontrast enhancement, consistent with renal cell
carcinoma. Example at 4.4 x 4.3 cm on [DATE]. 5.1 cm craniocaudal on
[DATE].

Multiple other bilateral renal lesions, including tiny precontrast
T1 hyperintense lesions on series 12. No other lesions demonstrate
postcontrast enhancement, including on subtracted images.

No hydronephrosis.

Stomach/Bowel: Normal stomach and abdominal bowel loops.

Vascular/Lymphatic: Aortic atherosclerosis. No renal vein extension.
Circumaortic left renal vein. No retroperitoneal or retrocrural
adenopathy.

Other:  No ascites.

Musculoskeletal: No acute osseous abnormality.
IMPRESSION: 1. Anterior interpolar right sided renal cell carcinoma. No evidence
of renal vein involvement or abdominal metastatic disease.
2. Multiple other bilateral renal lesions which are most consistent
with simple and complex cysts.
3.  Aortic Atherosclerosis ([id number]).

## 2023-06-14 ENCOUNTER — Encounter: Payer: Self-pay | Admitting: Neurology

## 2023-06-14 ENCOUNTER — Ambulatory Visit (INDEPENDENT_AMBULATORY_CARE_PROVIDER_SITE_OTHER): Payer: Medicare Other | Admitting: Neurology

## 2023-06-14 VITALS — BP 112/70 | Ht 69.0 in | Wt 188.0 lb

## 2023-06-14 DIAGNOSIS — G250 Essential tremor: Secondary | ICD-10-CM | POA: Diagnosis not present

## 2023-06-14 DIAGNOSIS — H539 Unspecified visual disturbance: Secondary | ICD-10-CM

## 2023-06-14 NOTE — Progress Notes (Signed)
Chief Complaint  Patient presents with   Follow-up    Rm 13, with wife, f/u tremors, no real vision changes      ASSESSMENT AND PLAN  Gavin Hernandez is a 77 y.o. male   Essential tremor  Long  history, no parkinsonian features,  TSH was normal  No limitation in his daily activity, select not to proceed with any treatment   Cerebral small vessel disease  MRI of the brain in October 22 showed mild small vessel disease no acute abnormality,  CT angiogram of head and neck showed no large vessel disease, echocardiogram showed no significant abnormality  Vascular risk factors of aging, hypertension, hyperlipidemia, Advised him to take aspirin 81 mg daily, increase water intake  Transient binocular diplopia Bilateral exophoria on examination, could due to transient breakdown of binocular fusion, acetylcholine receptor binding antibody was negative, No recurrent symptoms, followed up by ophthalmologist,     Only return to clinic for new issues  DIAGNOSTIC DATA (LABS, IMAGING, TESTING) - I reviewed patient records, labs, notes, testing and imaging myself where available.  Lab in April 2023, normal CBC with hemoglobin of 16.1, CMP creatinine of 0.97,   MEDICAL HISTORY:  Gavin Hernandez is a 77 year old male, seen in request by his primary care doctor Garth Bigness for evaluation of tremor, migraine, initial evaluation was on March 31, 2022  I reviewed and summarized the referring note. PMHX. HLD HTN Peripheral vascular disease. Hx of atrial fibrillation Right kidney cancer, s/p nephrectomy Lumbar decompression Hx of cervical fracture, hx of playing football.  He is a retired Scientist, clinical (histocompatibility and immunogenetics), complains of gradual onset bilateral hands tremor for many years, gradually getting worse, he denies a family history of essential tremor, it is more of a nuisance for him, no major limitation on his daily function  He reported to episode of sudden onset visual changes,  the first episode was in October 2022, while driving over the bridge, he had sudden onset difficulty focusing, lasting for less than 1 minute, was treated at emergency room, but then his symptoms went away  Personally reviewed MRI of the brain August 05, 2021: No acute abnormality, mild small vessel disease  CT angiogram of head and neck showed no large vessels disease  Echocardiogram ejection fraction 60 to 65%, no significant structural abnormality  Laboratory evaluation in 2022 showed normal or negative CMP ESR, C-reactive protein, CBC, It happened again in November 2022, while he was watching TV, he had double vision, he was able to pass closing 1 eye versus the other, able to identify it was binocular diplopia, he has clear vision using 1 eye only  He denies headache, no vertigo, no confusion during the spells,  UPDATE August 19th 2024: He is accompanied by his wife at today's clinical visit, overall doing very well, no longer has recurrent double vision, followed up by eye specialist, continue have bilateral hands tremor, no limitation in his daily activity, remain active  His sister died of Alport disease, he was given the diagnosis of Alport syndrome, 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 pylori, TSH, acetylcholine receptor antibody, BMP, hemoglobin  PHYSICAL EXAM:   Vitals:   06/14/23 0942  BP: 112/70    PHYSICAL EXAMNIATION:  Gen: NAD, conversant, well nourised, well groomed  Cardiovascular: Regular rate rhythm, no peripheral edema, warm, nontender. Eyes: Conjunctivae clear without exudates or hemorrhage Neck: Supple, no carotid bruits. Pulmonary: Clear to auscultation bilaterally   NEUROLOGICAL EXAM:  MENTAL STATUS: Speech/cognition: Awake, alert, oriented to history taking and casual conversation CRANIAL NERVES: CN II:  Visual fields are full to confrontation. Pupils are round equal and briskly reactive to light. CN III, IV, VI: extraocular movement are normal. No ptosis.  Covering uncover showed mild bilateral exophoria CN V: Facial sensation is intact to light touch CN VII: Face is symmetric with normal eye closure  CN VIII: Hearing is normal to causal conversation. CN IX, X: Phonation is normal. CN XI: Head turning and shoulder shrug are intact  MOTOR: Mild bilateral hand postural tremor, normal strength, no rigidity, no bradykinesia, mild to moderate difficulty drawing spiral circle  REFLEXES: Reflexes are 1 and symmetric at the biceps, triceps, knees, and ankles. Plantar responses are flexor.  SENSORY: Intact to light touch, pinprick and vibratory sensation are intact in fingers and toes.  COORDINATION: There is no trunk or limb dysmetria noted.  GAIT/STANCE: Posture is normal. Gait is steady   REVIEW OF SYSTEMS:  Full 14 system review of systems performed and notable only for as above All other review of systems were negative.   ALLERGIES: Allergies  Allergen Reactions   Bactrim [Sulfamethoxazole-Trimethoprim]    Lactose Intolerance (Gi) Diarrhea   Sulfa Antibiotics Hives and Itching    HOME MEDICATIONS: Current Outpatient Medications  Medication Sig Dispense Refill   atorvastatin (LIPITOR) 20 MG tablet Take 20 mg by mouth every evening.     BABY ASPIRIN PO Take by mouth.     Coenzyme Q10 (CO Q 10) 100 MG CAPS Take 1 tablet by mouth daily.     famotidine (PEPCID) 40 MG tablet Take 40 mg by mouth daily.     Fish Oil-Cholecalciferol (OMEGA-3 + D PO) Take by mouth.     losartan (COZAAR) 25 MG tablet Take 25 mg by mouth in the morning.     metoprolol tartrate (LOPRESSOR) 25 MG tablet Take 0.5 tablets (12.5 mg total) by mouth 2 (two) times daily. 90 tablet 2   Misc Natural Products (OSTEO BI-FLEX JOINT SHIELD PO) Take 2 tablets by mouth daily.     Multiple Vitamins-Minerals (MULTI FOR  HIM 50+) TABS Take 1 tablet by mouth daily.     omeprazole (PRILOSEC) 20 MG capsule Take 1 capsule (20 mg total) by mouth daily. 90 capsule 3   RESTASIS 0.05 % ophthalmic emulsion Place 1 drop into both eyes 2 (two) times daily.     Saw Palmetto 450 MG CAPS Take 1 tablet by mouth in the morning and at bedtime.     No current facility-administered medications for this visit.    PAST MEDICAL HISTORY: Past Medical History:  Diagnosis Date   AAA (abdominal aortic aneurysm) without rupture (HCC)    Abnormal echocardiogram    Aortic valve sclerosis    Arthritis    Atrial fibrillation (HCC)    Cancer (HCC)    skin & kidney   Cardiac murmur    Dyslipidemia    GERD (gastroesophageal reflux disease)    History of atrial fibrillation    History of kidney stones    Hypertension    Inguinal hernia    Irregular heart beat    Lipidemia    LVH (left ventricular hypertrophy)    MVP (mitral valve prolapse)    Non-rheumatic mitral regurgitation    Non-rheumatic mitral regurgitation  OSA (obstructive sleep apnea)    Palpitations    PVD (peripheral vascular disease) (HCC)    Renal disorder    Right renal mass    Secondary polycythemia    Skin cancer    Stage 2 chronic kidney disease    Tremor     PAST SURGICAL HISTORY: Past Surgical History:  Procedure Laterality Date   HERNIA REPAIR     no mesh   KNEE ARTHROSCOPY Left    x2   LUMBAR FUSION     ROBOT ASSISTED LAPAROSCOPIC NEPHRECTOMY Right 08/29/2021   Procedure: XI ROBOTIC ASSISTED LAPAROSCOPIC RADICAL NEPHRECTOMY;  Surgeon: Rene Paci, MD;  Location: [facility];  Service: Urology;  Laterality: Right;  ONLY NEEDS 180 MIN   SHOULDER ARTHROSCOPY     thumb surgery      FAMILY HISTORY: Family History  Problem Relation Age of Onset   Lung disease Mother    Other Father        MVA   Diabetes Sister    Heart Problems Brother        PACEMAKER   Colon cancer Neg Hx    Stomach cancer Neg Hx    Esophageal cancer Neg  Hx    Colon polyps Neg Hx     SOCIAL HISTORY: Social History   Socioeconomic History   Marital status: Married    Spouse name: Not on file   Number of children: 0   Years of education: Not on file   Highest education level: Not on file  Occupational History   Occupation: Retired  Tobacco Use   Smoking status: Never   Smokeless tobacco: Never  Vaping Use   Vaping status: Never Used  Substance and Sexual Activity   Alcohol use: Yes    Comment: occ   Drug use: Never   Sexual activity: Not on file  Other Topics Concern   Not on file  Social History Narrative   Right handed   Caffeine-1 cup occasionally   Lives at home with wife   Social Determinants of Health   Financial Resource Strain: Not on file  Food Insecurity: Not on file  Transportation Needs: Not on file  Physical Activity: Not on file  Stress: Not on file  Social Connections: Not on file  Intimate Partner Violence: Not on file      Levert Feinstein, M.D. Ph.D.  [facility] [address], [address] [place], [place] [address] Ph: [phone] Fax: [phone]  CC:  Shon Hale, MD [address] [place],  [place] [address]  Shon Hale, MD

## 2023-06-24 ENCOUNTER — Ambulatory Visit (INDEPENDENT_AMBULATORY_CARE_PROVIDER_SITE_OTHER): Payer: Medicare Other | Admitting: Podiatry

## 2023-06-24 DIAGNOSIS — M79672 Pain in left foot: Secondary | ICD-10-CM

## 2023-06-24 DIAGNOSIS — M7742 Metatarsalgia, left foot: Secondary | ICD-10-CM

## 2023-06-24 DIAGNOSIS — M778 Other enthesopathies, not elsewhere classified: Secondary | ICD-10-CM | POA: Diagnosis not present

## 2023-06-24 NOTE — Progress Notes (Signed)
Subjective:  Patient ID: Gavin Hernandez, male    DOB: Sep 08, 1946,  MRN: [id number]  Chief Complaint  Patient presents with   Foot Pain    Patient has a neuroma to left forefoot causing pain. He was seen in Oct 2023 and was given a steroid injection and it helped with the pain.     77 y.o. male presents with concern for pain in the left plantar forefoot.  He has pain due to pressure on the area especially if he walks barefoot.  He previously has been seen approximately last October 2023 by Dr. Loreta Ave and was treated for a neuroma with a steroid injection.  He says that he did have some pain relief with the steroid injection however he is still having discomfort in the area.  He denies any burning or tingling or radiating pain to the toes.  No issues on the right foot.  Past Medical History:  Diagnosis Date   AAA (abdominal aortic aneurysm) without rupture (HCC)    Abnormal echocardiogram    Aortic valve sclerosis    Arthritis    Atrial fibrillation (HCC)    Cancer (HCC)    skin & kidney   Cardiac murmur    Dyslipidemia    GERD (gastroesophageal reflux disease)    History of atrial fibrillation    History of kidney stones    Hypertension    Inguinal hernia    Irregular heart beat    Lipidemia    LVH (left ventricular hypertrophy)    MVP (mitral valve prolapse)    Non-rheumatic mitral regurgitation    Non-rheumatic mitral regurgitation    OSA (obstructive sleep apnea)    Palpitations    PVD (peripheral vascular disease) (HCC)    Renal disorder    Right renal mass    Secondary polycythemia    Skin cancer    Stage 2 chronic kidney disease    Tremor     Allergies  Allergen Reactions   Bactrim [Sulfamethoxazole-Trimethoprim]    Hylan G-F 20 Other (See Comments)   Lactose Intolerance (Gi) Diarrhea   Sulfa Antibiotics Hives and Itching    ROS: Negative except as per HPI above  Objective:  General: AAO x3, NAD  Dermatological: No open wounds or significant  hyperkeratotic lesions in the left plantar forefoot  Vascular:  Dorsalis Pedis artery and Posterior Tibial artery pedal pulses are 2/4 bilateral.  Capillary fill time < 3 sec to all digits.   Neruologic: Grossly intact via light touch bilateral. Protective threshold intact to all sites bilateral.   Musculoskeletal: Atrophy of plantar fat pad left foot.  There is palpable osseous prominence of the metatarsal heads 2 through 4 on the left plantar forefoot.  Second and third metatarsal head is primarily where the patient has pain.  Gait: Unassisted, Nonantalgic.   No images are attached to the encounter.  Assessment:   1. Metatarsalgia, left foot   2. Left foot pain   3. Capsulitis of foot, left      Plan:  Patient was evaluated and treated and all questions answered.  # Metatarsalgia of left foot, capsulitis second and third MPJ -Discussed with patient I do not detect neuroma given no significant pain with palpation in the intermetatarsal spaces -Patient does have pain however with direct palpation of the metatarsal heads which are prominent plantarly and there is diminished at bed -Discussed treatment options including steroid injection versus offloading and anti-inflammatories -Discussed risk of worsening fat pad atrophy with further steroid injections.  Patient wishes to defer -Recommend metatarsal padding including gel metatarsal sleeve pad that was dispensed to the patient x 2 at this visit.  try this and wear good max cushion shoes with gel inserts to cushion the area         Corinna Gab, DPM [facility] / [facility]

## 2023-07-05 ENCOUNTER — Ambulatory Visit (INDEPENDENT_AMBULATORY_CARE_PROVIDER_SITE_OTHER): Payer: Medicare Other | Admitting: Podiatry

## 2023-07-05 ENCOUNTER — Encounter: Payer: Self-pay | Admitting: Podiatry

## 2023-07-05 DIAGNOSIS — M7752 Other enthesopathy of left foot: Secondary | ICD-10-CM | POA: Diagnosis not present

## 2023-07-06 DIAGNOSIS — M7752 Other enthesopathy of left foot: Secondary | ICD-10-CM | POA: Diagnosis not present

## 2023-07-06 MED ORDER — TRIAMCINOLONE ACETONIDE 10 MG/ML IJ SUSP
10.0000 mg | Freq: Once | INTRAMUSCULAR | Status: AC
Start: 2023-07-06 — End: 2023-07-06
  Administered 2023-07-06: 10 mg via INTRA_ARTICULAR

## 2023-07-06 NOTE — Progress Notes (Signed)
Subjective:   Patient ID: Gavin Hernandez, male   DOB: 77 y.o.   MRN: [id number]   HPI Patient presents with inflammation around the lesser MPJs left with fluid buildup and pain   ROS      Objective:  Physical Exam  Inflammatory capsulitis of the left third and fourth MPJs     Assessment:  Pain and fluid buildup in these joint surfaces     Plan:  Sterile prep and went ahead today and did inject periarticular around the third and fourth MPJs 3 mg dexamethasone Kenalog 5 mg Xylocaine advised on rigid bottom shoes reappoint as needed

## 2024-01-17 ENCOUNTER — Ambulatory Visit (HOSPITAL_COMMUNITY)
Admission: RE | Admit: 2024-01-17 | Discharge: 2024-01-17 | Disposition: A | Discharge status: Home / Self Care | Source: Ambulatory Visit | Attending: Urology | Admitting: Urology

## 2024-01-17 ENCOUNTER — Other Ambulatory Visit (HOSPITAL_COMMUNITY): Payer: Self-pay | Admitting: Urology

## 2024-01-17 DIAGNOSIS — C641 Malignant neoplasm of right kidney, except renal pelvis: Secondary | ICD-10-CM

## 2024-01-26 ENCOUNTER — Ambulatory Visit: Payer: Self-pay | Admitting: Surgery

## 2024-04-18 ENCOUNTER — Encounter (INDEPENDENT_AMBULATORY_CARE_PROVIDER_SITE_OTHER): Payer: Self-pay | Admitting: Otolaryngology

## 2024-04-18 ENCOUNTER — Ambulatory Visit (INDEPENDENT_AMBULATORY_CARE_PROVIDER_SITE_OTHER): Admitting: Otolaryngology

## 2024-04-18 VITALS — BP 122/71 | HR 78

## 2024-04-18 DIAGNOSIS — R0982 Postnasal drip: Secondary | ICD-10-CM | POA: Diagnosis not present

## 2024-04-18 DIAGNOSIS — R053 Chronic cough: Secondary | ICD-10-CM | POA: Diagnosis not present

## 2024-04-18 DIAGNOSIS — K219 Gastro-esophageal reflux disease without esophagitis: Secondary | ICD-10-CM

## 2024-04-18 DIAGNOSIS — J3089 Other allergic rhinitis: Secondary | ICD-10-CM

## 2024-04-18 DIAGNOSIS — R0981 Nasal congestion: Secondary | ICD-10-CM

## 2024-04-18 DIAGNOSIS — J383 Other diseases of vocal cords: Secondary | ICD-10-CM

## 2024-04-18 NOTE — Patient Instructions (Addendum)
 [email or web address] - check out this website to learn more about reflux   -Avoid lying down for at least two hours after a meal or after drinking acidic beverages, like soda, or other caffeinated beverages. This can help to prevent stomach contents from flowing back into the esophagus. -Keep your head elevated while you sleep. Using an extra pillow or two can also help to prevent reflux. -Eat smaller and more frequent meals each day instead of a few large meals. This promotes digestion and can aid in preventing heartburn. -Wear loose-fitting clothes to ease pressure on the stomach, which can worsen heartburn and reflux. -Reduce excess weight around the midsection. This can ease pressure on the stomach. Such pressure can force some stomach contents back up the esophagus   - Take Reflux Gourmet (natural supplement available on [employer]) to help with symptoms of chronic throat irritation      Superior laryngeal nerve block for chronic cough, throat clearing, or pain  Some patients have symptoms from inflammation or hypersensitivity of the nerve that provides sensation to the inside of the throat. This nerve enters the throat right above the Adam's apple, and there is one on each side. It is called the "superior laryngeal nerve."  An injection of lidocaine and steroid can be given to this area where the sensory nerve enters the throat.  This sometimes helps patients with an oversensitive nerve or cough reflex. It "resets" an abnormal cough threshold or overactive pain signals from the throat. The steroid acts to decrease any inflammation around the nerve that could be adding to this hypersensitivity.  The injection may help right away, or it may take up to a week or two to start working.  If it hasn't helped at all after 2-3 weeks, Dr Tabithia Stroder will often try a second injection.  Some patients with no response to the first injection still have a good response to the second one.  There is no set  schedule for repeating these injections.  It is based entirely on how long they help your symptoms.  If you find the injection helpful but the symptoms come back several months later, then you can repeat it at that time.  Some patients never need a second injection.  Side effects from the injection are mostly related to numbness inside the throat.  If this happens, you may feel one or both sides of your throat go numb. This will last about 1 hour, and you should not swallow anything until the sensation returns to normal, usually about an hour.  It is sometimes frightening to patients when the sensation inside their throat changes, but it is not dangerous.  You may choose to stay in our waiting room until the feeling goes away, but this is not a requirement.  If this happens, it will improve gradually over the hour after the procedure. Other risks include accidental injection into the blood vessels, pain, temporary swallowing trouble or voice changes, and side effects related to steroids generally, like raised blood sugar or increased appetite. Many patients do not notice any steroid-related side effects from the injection, but if you have never taken steroids or aren't familiar with their effects, you should ask Dr Kassiah Mccrory for additional details.  Diabetics shouyld plan to check their blood sugar more frequently in the 2 days after the injection.  The injection takes approximately 1 minute per side, and\ it is performed during a routine office visit. There are no precautions afterward except for not swallowing for 1 hour if  throat numbness occurs. Patients can drive themselves to and from the visit. There is no recovery time, but some patients have mild bruising or tenderness at the injection site.

## 2024-04-18 NOTE — Progress Notes (Signed)
 ENT CONSULT:  Reason for Consult: chronic cough    HPI: Discussed the use of AI scribe software for clinical note transcription with the patient, who gave verbal consent to proceed.  History of Present Illness Gavin Hernandez is a 78 year old male with hx of GERD who presents with chronic cough x 1.5 yrs.  He has been experiencing chronic cough for approximately a year and a half. The cough is sometimes productive with phlegm and other times dry, and it is worst in the morning. He recalls that his cough improved temporarily following rectal surgery, which he attributes to a change in diet and reduced food intake during recovery.  He has a history of GERD and has been on medications such as Prilosec. He is currently taking Xyzal for postnasal drainage and has tried various nasal sprays, though he finds them generally ineffective, often causing nasal blockage. A normal chest X-ray and a swallow study have been conducted this year.   In terms of family history, his sister has a similar chronic cough.   He recently moved permanently to [place] from [place] after living there for 23 years.   Records Reviewed:  Neurology note 06/14/23 AZEEZ DUNKER is a 78 y.o. male   Essential tremor             Long  history, no parkinsonian features,             TSH was normal             No limitation in his daily activity, select not to proceed with any treatment     Cerebral small vessel disease             MRI of the brain in October 22 showed mild small vessel disease no acute abnormality,             CT angiogram of head and neck showed no large vessel disease, echocardiogram showed no significant abnormality             Vascular risk factors of aging, hypertension, hyperlipidemia, Advised him to take aspirin 81 mg daily, increase water intake   Transient binocular diplopia Bilateral exophoria on examination, could due to transient breakdown of binocular fusion, acetylcholine receptor binding  antibody was negative, No recurrent symptoms, followed up by ophthalmologist,    Past Medical History:  Diagnosis Date   AAA (abdominal aortic aneurysm) without rupture (HCC)    Abnormal echocardiogram    Aortic valve sclerosis    Arthritis    Atrial fibrillation (HCC)    Cancer (HCC)    skin & kidney   Cardiac murmur    Dyslipidemia    GERD (gastroesophageal reflux disease)    History of atrial fibrillation    History of kidney stones    Hypertension    Inguinal hernia    Irregular heart beat    Lipidemia    LVH (left ventricular hypertrophy)    MVP (mitral valve prolapse)    Non-rheumatic mitral regurgitation    Non-rheumatic mitral regurgitation    OSA (obstructive sleep apnea)    Palpitations    PVD (peripheral vascular disease) (HCC)    Renal disorder    Right renal mass    Secondary polycythemia    Skin cancer    Stage 2 chronic kidney disease    Tremor     Past Surgical History:  Procedure Laterality Date   HERNIA REPAIR     no mesh   KNEE ARTHROSCOPY  Left    x2   LUMBAR FUSION     ROBOT ASSISTED LAPAROSCOPIC NEPHRECTOMY Right 08/29/2021   Procedure: XI ROBOTIC ASSISTED LAPAROSCOPIC RADICAL NEPHRECTOMY;  Surgeon: Devere Lonni Righter, MD;  Location: [facility];  Service: Urology;  Laterality: Right;  ONLY NEEDS 180 MIN   SHOULDER ARTHROSCOPY     thumb surgery      Family History  Problem Relation Age of Onset   Lung disease Mother    Other Father        MVA   Diabetes Sister    Heart Problems Brother        PACEMAKER   Colon cancer Neg Hx    Stomach cancer Neg Hx    Esophageal cancer Neg Hx    Colon polyps Neg Hx     Social History:  reports that he has never smoked. He has never used smokeless tobacco. He reports current alcohol use. He reports that he does not use drugs.  Allergies:  Allergies  Allergen Reactions   Bactrim [Sulfamethoxazole-Trimethoprim]    Hylan G-F 20 Other (See Comments)   Lactose Intolerance (Gi) Diarrhea   Sulfa  Antibiotics Hives and Itching    Medications: I have reviewed the patient's current medications.  The PMH, PSH, Medications, Allergies, and SH were reviewed and updated.  ROS: Constitutional: Negative for fever, weight loss and weight gain. Cardiovascular: Negative for chest pain and dyspnea on exertion. Respiratory: Is not experiencing shortness of breath at rest. Gastrointestinal: Negative for nausea and vomiting. Neurological: Negative for headaches. Psychiatric: The patient is not nervous/anxious  Blood pressure 122/71, pulse 78, SpO2 94%. There is no height or weight on file to calculate BMI.  PHYSICAL EXAM:  Exam: General: Well-developed, well-nourished Respiratory Respiratory effort: Equal inspiration and expiration without stridor Cardiovascular Peripheral Vascular: Warm extremities with equal color/perfusion Eyes: No nystagmus with equal extraocular motion bilaterally Neuro/Psych/Balance: Patient oriented to person, place, and time; Appropriate mood and affect; Gait is intact with no imbalance; Cranial nerves I-XII are intact Head and Face Inspection: Normocephalic and atraumatic without mass or lesion Palpation: Facial skeleton intact without bony stepoffs Salivary Glands: No mass or tenderness Facial Strength: Facial motility symmetric and full bilaterally ENT Pinna: External ear intact and fully developed External canal: Canal is patent with intact skin Tympanic Membrane: Clear and mobile External Nose: No scar or anatomic deformity Internal Nose: Septum is relatively straight. No polyp, or purulence. Mucosal edema and erythema present.  Bilateral inferior turbinate hypertrophy.  Lips, Teeth, and gums: Mucosa and teeth intact and viable TMJ: No pain to palpation with full mobility Oral cavity/oropharynx: No erythema or exudate, no lesions present Nasopharynx: No mass or lesion with intact mucosa Hypopharynx: Intact mucosa without pooling of  secretions Larynx Glottic: Full true vocal cord mobility without lesion or mass Supraglottic: Normal appearing epiglottis and AE folds Interarytenoid Space: Moderate pachydermia&edema Subglottic Space: Patent without lesion or edema Neck Neck and Trachea: Midline trachea without mass or lesion Thyroid: No mass or nodularity Lymphatics: No lymphadenopathy  Procedure: Preoperative diagnosis: chronic cough   Postoperative diagnosis:   Same + GERD LPR  Procedure: Flexible fiberoptic laryngoscopy  Surgeon: Elena Larry, MD  Anesthesia: Topical lidocaine  and Afrin Complications: None Condition is stable throughout exam  Indications and consent:  The patient presents to the clinic with above symptoms. Indirect laryngoscopy view was incomplete. Thus it was recommended that they undergo a flexible fiberoptic laryngoscopy. All of the risks, benefits, and potential complications were reviewed with the patient preoperatively and verbal  informed consent was obtained.  Procedure: The patient was seated upright in the clinic. Topical lidocaine  and Afrin were applied to the nasal cavity. After adequate anesthesia had occurred, I then proceeded to pass the flexible telescope into the nasal cavity. The nasal cavity was patent without rhinorrhea or polyp. The nasopharynx was also patent without mass or lesion. The base of tongue was visualized and was normal. There were no signs of pooling of secretions in the piriform sinuses. The true vocal folds were mobile bilaterally. There were no signs of glottic or supraglottic mucosal lesion or mass. There was moderate interarytenoid pachydermia and post cricoid edema. The telescope was then slowly withdrawn and the patient tolerated the procedure throughout.      Studies Reviewed: CXR 01/17/24 Narrative & Impression  CLINICAL DATA:  Renal cell carcinoma.   EXAM: CHEST - 2 VIEW   COMPARISON:  01/25/2023.   FINDINGS: Cardiac silhouette is normal in  size and configuration. No mediastinal or hilar masses. No evidence of adenopathy.   Clear lungs.  No pleural effusion or pneumothorax.   Skeletal structures are intact.   IMPRESSION: No active cardiopulmonary disease.   Esophagram 02/08/24 FINDINGS: No evidence of vestibular penetration or aspiration during swallowing. Pharynx and cervical esophagus are unremarkable.   No evidence of esophageal mass or stricture. No findings of esophagitis noted. Esophageal motility is within normal limits.   No evidence of hiatal hernia or gastroesophgeal reflux. An ingested 13mm barium tablet passed freely through the esophagus, and into the stomach.   IMPRESSION: Normal study.  Assessment/Plan: Encounter Diagnoses  Name Primary?   Chronic cough Yes   Chronic GERD    Environmental and seasonal allergies    Post-nasal drip     Assessment and Plan Assessment & Plan Chronic cough Chronic cough for about a year and a half, likely due to postnasal drip and GERD or a combination of the two, mostly dry, and non-productive. Normal chest X-ray and swallow study. Vocal cords thin on scope exam, likely age-related, and there was evidence of GERD LPR, otherwise scope exam today was normal.  I discussed common etiologies of chronic cough including GERD/allergies and PND and lung disease with the patient and explained that in most cases the cause of chronic cough is multi-factorial - trial of reflux gourmet - Recommend dietary changes. - Continue Xyzal for postnasal drip. - Consider nerve block procedure if symptoms persist.  Chronic nasal congestion and post-nasal drainage Evidence of post-nasal drainage during flexible scope exam today, could be contributing to sx - consider Flonase 2 puffs b/l nares BID - consider nasal saline rinses  - continue Xyzal  Gastroesophageal reflux disease (GERD) GERD likely contributing to chronic cough. Symptoms improve when not eating, indicating reflux link.  Discussed seaweed paste supplement to reduce reflux. - continue Prilosec -  Reflux Gourmet after meals - diet and lifestyle changes to minimize GERD - Refer to [employer] blog for dietary and lifestyle modifications/reflux cook book  RTC 2-3 mo will do SLN block if sx persist   Thank you for allowing me to participate in the care of this patient. Please do not hesitate to contact me with any questions or concerns.   Elena Larry, MD Otolaryngology [facility] Phone: [phone] Fax: [phone]    04/18/2024, 2:19 PM

## 2024-04-24 ENCOUNTER — Emergency Department (HOSPITAL_BASED_OUTPATIENT_CLINIC_OR_DEPARTMENT_OTHER)

## 2024-04-24 ENCOUNTER — Inpatient Hospital Stay (HOSPITAL_BASED_OUTPATIENT_CLINIC_OR_DEPARTMENT_OTHER)
Admission: EM | Admit: 2024-04-24 | Discharge: 2024-04-29 | DRG: 699 | Disposition: A | Discharge status: Home / Self Care | Attending: Internal Medicine | Admitting: Internal Medicine

## 2024-04-24 ENCOUNTER — Encounter (HOSPITAL_COMMUNITY): Payer: Self-pay

## 2024-04-24 ENCOUNTER — Other Ambulatory Visit: Payer: Self-pay

## 2024-04-24 DIAGNOSIS — R531 Weakness: Secondary | ICD-10-CM | POA: Insufficient documentation

## 2024-04-24 DIAGNOSIS — I480 Paroxysmal atrial fibrillation: Secondary | ICD-10-CM | POA: Diagnosis present

## 2024-04-24 DIAGNOSIS — N179 Acute kidney failure, unspecified: Secondary | ICD-10-CM | POA: Diagnosis present

## 2024-04-24 DIAGNOSIS — Z888 Allergy status to other drugs, medicaments and biological substances status: Secondary | ICD-10-CM | POA: Diagnosis not present

## 2024-04-24 DIAGNOSIS — R609 Edema, unspecified: Secondary | ICD-10-CM | POA: Diagnosis not present

## 2024-04-24 DIAGNOSIS — T83518A Infection and inflammatory reaction due to other urinary catheter, initial encounter: Principal | ICD-10-CM | POA: Diagnosis present

## 2024-04-24 DIAGNOSIS — N3001 Acute cystitis with hematuria: Secondary | ICD-10-CM

## 2024-04-24 DIAGNOSIS — Z79899 Other long term (current) drug therapy: Secondary | ICD-10-CM

## 2024-04-24 DIAGNOSIS — R197 Diarrhea, unspecified: Secondary | ICD-10-CM | POA: Diagnosis present

## 2024-04-24 DIAGNOSIS — N401 Enlarged prostate with lower urinary tract symptoms: Secondary | ICD-10-CM | POA: Diagnosis present

## 2024-04-24 DIAGNOSIS — Z85828 Personal history of other malignant neoplasm of skin: Secondary | ICD-10-CM | POA: Diagnosis not present

## 2024-04-24 DIAGNOSIS — R338 Other retention of urine: Secondary | ICD-10-CM | POA: Diagnosis present

## 2024-04-24 DIAGNOSIS — N3000 Acute cystitis without hematuria: Secondary | ICD-10-CM | POA: Diagnosis present

## 2024-04-24 DIAGNOSIS — N10 Acute tubulo-interstitial nephritis: Principal | ICD-10-CM | POA: Diagnosis present

## 2024-04-24 DIAGNOSIS — B9689 Other specified bacterial agents as the cause of diseases classified elsewhere: Secondary | ICD-10-CM | POA: Diagnosis present

## 2024-04-24 DIAGNOSIS — Z7982 Long term (current) use of aspirin: Secondary | ICD-10-CM

## 2024-04-24 DIAGNOSIS — Z836 Family history of other diseases of the respiratory system: Secondary | ICD-10-CM

## 2024-04-24 DIAGNOSIS — B961 Klebsiella pneumoniae [K. pneumoniae] as the cause of diseases classified elsewhere: Secondary | ICD-10-CM | POA: Diagnosis not present

## 2024-04-24 DIAGNOSIS — N1831 Chronic kidney disease, stage 3a: Secondary | ICD-10-CM | POA: Diagnosis present

## 2024-04-24 DIAGNOSIS — I129 Hypertensive chronic kidney disease with stage 1 through stage 4 chronic kidney disease, or unspecified chronic kidney disease: Secondary | ICD-10-CM | POA: Diagnosis present

## 2024-04-24 DIAGNOSIS — E860 Dehydration: Secondary | ICD-10-CM | POA: Diagnosis present

## 2024-04-24 DIAGNOSIS — Z91011 Allergy to milk products: Secondary | ICD-10-CM | POA: Diagnosis not present

## 2024-04-24 DIAGNOSIS — G4733 Obstructive sleep apnea (adult) (pediatric): Secondary | ICD-10-CM | POA: Diagnosis present

## 2024-04-24 DIAGNOSIS — Y828 Other medical devices associated with adverse incidents: Secondary | ICD-10-CM | POA: Diagnosis present

## 2024-04-24 DIAGNOSIS — N390 Urinary tract infection, site not specified: Secondary | ICD-10-CM | POA: Diagnosis present

## 2024-04-24 DIAGNOSIS — N1832 Chronic kidney disease, stage 3b: Secondary | ICD-10-CM | POA: Diagnosis present

## 2024-04-24 DIAGNOSIS — Z87442 Personal history of urinary calculi: Secondary | ICD-10-CM

## 2024-04-24 DIAGNOSIS — Z8249 Family history of ischemic heart disease and other diseases of the circulatory system: Secondary | ICD-10-CM | POA: Diagnosis not present

## 2024-04-24 DIAGNOSIS — Z8719 Personal history of other diseases of the digestive system: Secondary | ICD-10-CM

## 2024-04-24 DIAGNOSIS — K219 Gastro-esophageal reflux disease without esophagitis: Secondary | ICD-10-CM | POA: Diagnosis present

## 2024-04-24 DIAGNOSIS — Z833 Family history of diabetes mellitus: Secondary | ICD-10-CM

## 2024-04-24 DIAGNOSIS — N400 Enlarged prostate without lower urinary tract symptoms: Secondary | ICD-10-CM | POA: Diagnosis present

## 2024-04-24 DIAGNOSIS — R7881 Bacteremia: Secondary | ICD-10-CM | POA: Diagnosis present

## 2024-04-24 DIAGNOSIS — Z66 Do not resuscitate: Secondary | ICD-10-CM | POA: Diagnosis present

## 2024-04-24 DIAGNOSIS — Z1152 Encounter for screening for COVID-19: Secondary | ICD-10-CM

## 2024-04-24 DIAGNOSIS — E7800 Pure hypercholesterolemia, unspecified: Secondary | ICD-10-CM | POA: Diagnosis present

## 2024-04-24 DIAGNOSIS — M7989 Other specified soft tissue disorders: Secondary | ICD-10-CM | POA: Diagnosis not present

## 2024-04-24 DIAGNOSIS — Z905 Acquired absence of kidney: Secondary | ICD-10-CM

## 2024-04-24 DIAGNOSIS — R823 Hemoglobinuria: Secondary | ICD-10-CM | POA: Diagnosis present

## 2024-04-24 DIAGNOSIS — M25571 Pain in right ankle and joints of right foot: Secondary | ICD-10-CM | POA: Diagnosis present

## 2024-04-24 DIAGNOSIS — B952 Enterococcus as the cause of diseases classified elsewhere: Secondary | ICD-10-CM | POA: Diagnosis present

## 2024-04-24 DIAGNOSIS — D751 Secondary polycythemia: Secondary | ICD-10-CM | POA: Diagnosis present

## 2024-04-24 DIAGNOSIS — Z882 Allergy status to sulfonamides status: Secondary | ICD-10-CM | POA: Diagnosis not present

## 2024-04-24 DIAGNOSIS — R601 Generalized edema: Secondary | ICD-10-CM | POA: Diagnosis present

## 2024-04-24 DIAGNOSIS — K644 Residual hemorrhoidal skin tags: Secondary | ICD-10-CM | POA: Diagnosis present

## 2024-04-24 LAB — PROTIME-INR
INR: 1.2 (ref 0.8–1.2)
Prothrombin Time: 15.6 s — ABNORMAL HIGH (ref 11.4–15.2)

## 2024-04-24 LAB — COMPREHENSIVE METABOLIC PANEL WITH GFR
ALT: 21 U/L (ref 0–44)
AST: 23 U/L (ref 15–41)
Albumin: 3.5 g/dL (ref 3.5–5.0)
Alkaline Phosphatase: 65 U/L (ref 38–126)
Anion gap: 10 (ref 5–15)
BUN: 35 mg/dL — ABNORMAL HIGH (ref 8–23)
CO2: 24 mmol/L (ref 22–32)
Calcium: 9 mg/dL (ref 8.9–10.3)
Chloride: 100 mmol/L (ref 98–111)
Creatinine, Ser: 2.08 mg/dL — ABNORMAL HIGH (ref 0.61–1.24)
GFR, Estimated: 32 mL/min — ABNORMAL LOW (ref >60)
Glucose, Bld: 94 mg/dL (ref 70–99)
Potassium: 4.3 mmol/L (ref 3.5–5.1)
Sodium: 133 mmol/L — ABNORMAL LOW (ref 135–145)
Total Bilirubin: 0.7 mg/dL (ref 0.0–1.2)
Total Protein: 6 g/dL — ABNORMAL LOW (ref 6.5–8.1)

## 2024-04-24 LAB — CBC WITH DIFFERENTIAL/PLATELET
Abs Immature Granulocytes: 0.04 10*3/uL (ref 0.00–0.07)
Basophils Absolute: 0 10*3/uL (ref 0.0–0.1)
Basophils Relative: 0 %
Eosinophils Absolute: 0 10*3/uL (ref 0.0–0.5)
Eosinophils Relative: 0 %
HCT: 36.5 % — ABNORMAL LOW (ref 39.0–52.0)
Hemoglobin: 12.4 g/dL — ABNORMAL LOW (ref 13.0–17.0)
Immature Granulocytes: 0 %
Lymphocytes Relative: 9 %
Lymphs Abs: 0.9 10*3/uL (ref 0.7–4.0)
MCH: 32 pg (ref 26.0–34.0)
MCHC: 34 g/dL (ref 30.0–36.0)
MCV: 94.3 fL (ref 80.0–100.0)
Monocytes Absolute: 1.5 10*3/uL — ABNORMAL HIGH (ref 0.1–1.0)
Monocytes Relative: 14 %
Neutro Abs: 8.4 10*3/uL — ABNORMAL HIGH (ref 1.7–7.7)
Neutrophils Relative %: 77 %
Platelets: 220 10*3/uL (ref 150–400)
RBC: 3.87 MIL/uL — ABNORMAL LOW (ref 4.22–5.81)
RDW: 12.4 % (ref 11.5–15.5)
WBC: 11 10*3/uL — ABNORMAL HIGH (ref 4.0–10.5)
nRBC: 0 % (ref 0.0–0.2)

## 2024-04-24 LAB — RESP PANEL BY RT-PCR (RSV, FLU A&B, COVID)  RVPGX2
Influenza A by PCR: NEGATIVE
Influenza B by PCR: NEGATIVE
Resp Syncytial Virus by PCR: NEGATIVE
SARS Coronavirus 2 by RT PCR: NEGATIVE

## 2024-04-24 LAB — URINALYSIS, W/ REFLEX TO CULTURE (INFECTION SUSPECTED)
Bilirubin Urine: NEGATIVE
Glucose, UA: NEGATIVE mg/dL
Ketones, ur: NEGATIVE mg/dL
Nitrite: POSITIVE — AB
Protein, ur: NEGATIVE mg/dL
Specific Gravity, Urine: <1.005 — ABNORMAL LOW (ref 1.005–1.030)
pH: 5.5 (ref 5.0–8.0)

## 2024-04-24 LAB — LACTIC ACID, PLASMA: Lactic Acid, Venous: 0.8 mmol/L (ref 0.5–1.9)

## 2024-04-24 MED ORDER — LACTATED RINGERS IV SOLN: INTRAVENOUS | Status: DC

## 2024-04-24 MED ORDER — LACTATED RINGERS IV BOLUS (SEPSIS)
1000.0000 mL | Freq: Once | INTRAVENOUS | Status: AC
  Administered 2024-04-24: 1000 mL via INTRAVENOUS

## 2024-04-24 MED ORDER — ENOXAPARIN SODIUM 40 MG/0.4ML IJ SOSY
40.0000 mg | PREFILLED_SYRINGE | INTRAMUSCULAR | Status: DC
  Administered 2024-04-24 – 2024-04-25 (×2): 40 mg via SUBCUTANEOUS
  Filled 2024-04-24 (×2): qty 0.4

## 2024-04-24 MED ORDER — ACETAMINOPHEN 650 MG RE SUPP: 650.0000 mg | Freq: Four times a day (QID) | RECTAL | Status: DC | PRN

## 2024-04-24 MED ORDER — ACETAMINOPHEN 325 MG PO TABS
650.0000 mg | ORAL_TABLET | Freq: Four times a day (QID) | ORAL | Status: DC | PRN
  Administered 2024-04-27 – 2024-04-29 (×5): 650 mg via ORAL
  Filled 2024-04-24 (×5): qty 2

## 2024-04-24 MED ORDER — ATORVASTATIN CALCIUM 10 MG PO TABS
20.0000 mg | ORAL_TABLET | Freq: Every evening | ORAL | Status: DC
  Administered 2024-04-24 – 2024-04-28 (×5): 20 mg via ORAL
  Filled 2024-04-24 (×4): qty 2

## 2024-04-24 MED ORDER — PANTOPRAZOLE SODIUM 40 MG PO TBEC
40.0000 mg | DELAYED_RELEASE_TABLET | Freq: Every day | ORAL | Status: DC
  Administered 2024-04-25: 40 mg via ORAL
  Filled 2024-04-24: qty 1

## 2024-04-24 MED ORDER — SODIUM CHLORIDE 0.9 % IV SOLN
1.0000 g | INTRAVENOUS | Status: DC
  Administered 2024-04-25: 1 g via INTRAVENOUS
  Filled 2024-04-24: qty 10

## 2024-04-24 MED ORDER — ONDANSETRON HCL 4 MG/2ML IJ SOLN
4.0000 mg | Freq: Four times a day (QID) | INTRAMUSCULAR | Status: DC | PRN
  Administered 2024-04-25: 4 mg via INTRAVENOUS
  Filled 2024-04-24: qty 2

## 2024-04-24 MED ORDER — ONDANSETRON HCL 4 MG PO TABS
4.0000 mg | ORAL_TABLET | Freq: Four times a day (QID) | ORAL | Status: DC | PRN
Start: 2024-04-24 — End: 2024-04-29

## 2024-04-24 MED ORDER — SODIUM CHLORIDE 0.9 % IV SOLN
2.0000 g | Freq: Once | INTRAVENOUS | Status: AC
  Administered 2024-04-24: 2 g via INTRAVENOUS
  Filled 2024-04-24: qty 12.5

## 2024-04-24 MED ORDER — ENSURE PLUS HIGH PROTEIN PO LIQD
237.0000 mL | Freq: Three times a day (TID) | ORAL | Status: DC
  Administered 2024-04-24 – 2024-04-29 (×8): 237 mL via ORAL

## 2024-04-24 MED ORDER — CHLORHEXIDINE GLUCONATE CLOTH 2 % EX PADS
6.0000 | MEDICATED_PAD | Freq: Every day | CUTANEOUS | Status: DC
  Administered 2024-04-25 – 2024-04-29 (×5): 6 via TOPICAL

## 2024-04-24 MED ORDER — TAMSULOSIN HCL 0.4 MG PO CAPS
0.4000 mg | ORAL_CAPSULE | Freq: Two times a day (BID) | ORAL | Status: DC
  Administered 2024-04-24 – 2024-04-25 (×2): 0.4 mg via ORAL
  Filled 2024-04-24 (×2): qty 1

## 2024-04-24 MED ORDER — FAMOTIDINE 20 MG PO TABS
40.0000 mg | ORAL_TABLET | Freq: Every day | ORAL | Status: DC
  Administered 2024-04-25 – 2024-04-26 (×2): 40 mg via ORAL
  Filled 2024-04-24 (×2): qty 2

## 2024-04-24 MED ORDER — SENNOSIDES-DOCUSATE SODIUM 8.6-50 MG PO TABS: 1.0000 | ORAL_TABLET | Freq: Every evening | ORAL | Status: DC | PRN

## 2024-04-24 MED ORDER — FAMOTIDINE 20 MG PO TABS
40.0000 mg | ORAL_TABLET | Freq: Once | ORAL | Status: AC
  Administered 2024-04-24: 40 mg via ORAL
  Filled 2024-04-24: qty 2

## 2024-04-24 MED ORDER — ACETAMINOPHEN 500 MG PO TABS
1000.0000 mg | ORAL_TABLET | Freq: Once | ORAL | Status: AC
  Administered 2024-04-24: 1000 mg via ORAL
  Filled 2024-04-24: qty 2

## 2024-04-24 MED ORDER — ADULT MULTIVITAMIN W/MINERALS CH
1.0000 | ORAL_TABLET | Freq: Every day | ORAL | Status: DC
  Administered 2024-04-25 – 2024-04-29 (×5): 1 via ORAL
  Filled 2024-04-24 (×6): qty 1

## 2024-04-24 MED ORDER — CYCLOSPORINE 0.05 % OP EMUL
1.0000 [drp] | Freq: Two times a day (BID) | OPHTHALMIC | Status: DC
  Administered 2024-04-24 – 2024-04-29 (×8): 1 [drp] via OPHTHALMIC
  Filled 2024-04-24 (×11): qty 30

## 2024-04-24 NOTE — ED Triage Notes (Signed)
 Patient states he has had a fever since last night. Pt concerned for UTI because he has indwelling urinary catheter and only has one kidney.

## 2024-04-24 NOTE — H&P (Signed)
 History and Physical  Gavin Hernandez FMW:[id number] DOB: April 23, 1946 DOA: 04/24/2024  PCP: Chrystal Lamarr RAMAN, MD   Chief Complaint: Fevers and chills  HPI: Gavin Hernandez is a 78 y.o. male with medical history significant for abdominal aortic aneurysm, atrial fibrillation not on anticoagulation, kidney cancer, GERD, CKD 3A, renal mass s/p right-sided nephrectomy, solitary kidney and urinary retention s/p indwelling Foley who presented to [facility] for evaluation of fevers and chills.  Patient reports she has been having persistent fevers and chills since last night.  He called his general surgeon and urologist and due to his solitary kidney, he was advised to present to the ED with concern for possible UTI.  He endorse associated dry heaving but denies any abdominal pain, dysuria, hematuria, shortness of breath, headache or rectal bleeding.  He recently had a hemorrhoidectomy in early June and had a indwelling Foley catheter placed due to urinary retention and inability to pass trial of void x 3.  [facility] ED Course: Initial vitals show temp 101.3, RR 20, HR 66, BP 121/72, SpO2 97% on room air. Initial labs significant for sodium 133, BUN/creatinine 35/2.08, WBC 11.0, Hgb 12.4, negative COVID, RSV and flu test, UA shows moderate hemoglobinuria positive nitrite, small leuks, RBC 11-20, WBC 6-10 and rare bacteria. EKG shows sinus rhythm with occasional APCs. CXR shows no active disease. Pt received IV cefepime, Tylenol  1 g, Pepcid  40 mg, IV LR 1 L bolus followed by IV LR infusion at 150 cc/h.  Patient was admitted to [facility] service and transferred to [facility].  Review of Systems: Please see HPI for pertinent positives and negatives. A complete 10 system review of systems are otherwise negative.  Past Medical History:  Diagnosis Date   AAA (abdominal aortic aneurysm) without rupture (HCC)    Abnormal echocardiogram    Aortic valve sclerosis    Arthritis    Atrial fibrillation (HCC)    Cancer  (HCC)    skin & kidney   Cardiac murmur    Dyslipidemia    GERD (gastroesophageal reflux disease)    History of atrial fibrillation    History of kidney stones    Hypertension    Inguinal hernia    Irregular heart beat    Lipidemia    LVH (left ventricular hypertrophy)    MVP (mitral valve prolapse)    Non-rheumatic mitral regurgitation    Non-rheumatic mitral regurgitation    OSA (obstructive sleep apnea)    Palpitations    PVD (peripheral vascular disease) (HCC)    Renal disorder    Right renal mass    Secondary polycythemia    Skin cancer    Stage 2 chronic kidney disease    Tremor    Past Surgical History:  Procedure Laterality Date   HERNIA REPAIR     no mesh   KNEE ARTHROSCOPY Left    x2   LUMBAR FUSION     ROBOT ASSISTED LAPAROSCOPIC NEPHRECTOMY Right 08/29/2021   Procedure: XI ROBOTIC ASSISTED LAPAROSCOPIC RADICAL NEPHRECTOMY;  Surgeon: Devere Lonni Righter, MD;  Location: [facility];  Service: Urology;  Laterality: Right;  ONLY NEEDS 180 MIN   SHOULDER ARTHROSCOPY     thumb surgery     Social History:  reports that he has never smoked. He has never used smokeless tobacco. He reports current alcohol use. He reports that he does not use drugs.  Allergies  Allergen Reactions   Bactrim [Sulfamethoxazole-Trimethoprim]    Hylan G-F 20 Other (See Comments)   Lactose  Intolerance (Gi) Diarrhea   Sulfa Antibiotics Hives and Itching    Family History  Problem Relation Age of Onset   Lung disease Mother    Other Father        MVA   Diabetes Sister    Heart Problems Brother        PACEMAKER   Colon cancer Neg Hx    Stomach cancer Neg Hx    Esophageal cancer Neg Hx    Colon polyps Neg Hx      Prior to Admission medications   Medication Sig Start Date End Date Taking? Authorizing Provider  atorvastatin  (LIPITOR) 20 MG tablet Take 20 mg by mouth every evening. 07/10/21   [provider]  atorvastatin  (LIPITOR) 20 MG tablet Take 20 mg by mouth daily.     [provider]  BABY ASPIRIN PO Take by mouth.    [provider]  Coenzyme Q10 (CO Q 10) 100 MG CAPS Take 1 tablet by mouth daily.    [provider]  cycloSPORINE  (RESTASIS ) 0.05 % ophthalmic emulsion Place 1 drop into both eyes daily. 06/01/21   [provider]  famotidine  (PEPCID ) 40 MG tablet Take 40 mg by mouth daily.    [provider]  famotidine  (PEPCID ) 40 MG tablet Take 40 mg by mouth daily.    [provider]  Fish Oil-Cholecalciferol (OMEGA-3 + D PO) Take by mouth.    [provider]  losartan  (COZAAR ) 25 MG tablet Take 25 mg by mouth in the morning. 06/19/21   [provider]  losartan  (COZAAR ) 25 MG tablet Take 25 mg by mouth daily. 06/19/21   [provider]  metoprolol  succinate (TOPROL -XL) 25 MG 24 hr tablet Take 25 mg by mouth daily. 04/17/24   [provider]  metoprolol  tartrate (LOPRESSOR ) 25 MG tablet Take 0.5 tablets (12.5 mg total) by mouth 2 (two) times daily. 04/27/23   Thukkani, Arun K, MD  Misc Natural Products (OSTEO BI-FLEX JOINT SHIELD PO) Take 2 tablets by mouth daily.    [provider]  Multiple Vitamins-Minerals (MULTI FOR HIM 50+) TABS Take 1 tablet by mouth daily.    [provider]  Omega-3 Fat Ac-Cholecalciferol (DRY EYE OMEGA BENEFITS/VIT D-3 PO) Take 3 capsules by mouth daily.    [provider]  omeprazole  (PRILOSEC) 20 MG capsule Take 1 capsule (20 mg total) by mouth daily. 05/07/22   Stacia Glendia BRAVO, MD  omeprazole  (PRILOSEC) 20 MG capsule Take 20 mg by mouth daily. 05/07/22   [provider]  RESTASIS  0.05 % ophthalmic emulsion Place 1 drop into both eyes 2 (two) times daily. 06/01/21   [provider]  Saw Palmetto 450 MG CAPS Take 1 tablet by mouth in the morning and at bedtime.    [provider]  tamsulosin (FLOMAX) 0.4 MG CAPS capsule Take 0.4 mg by mouth 2 (two) times daily.    [provider]     Physical Exam: BP 114/69 (BP Location: Left Arm)   Pulse 70   Temp 99.9 F (37.7 C) (Oral)   Resp (!) 26   SpO2 93%  General: Pleasant, well-appearing elderly man laying in bed. No acute distress. HEENT: [place]/AT. Anicteric sclera CV: RRR. II/VI systolic murmur. No LE edema Pulmonary: Lungs CTAB. Normal effort. No wheezing or rales. Abdominal: Soft, nontender, nondistended. Normal bowel sounds. Extremities: Palpable radial and DP pulses. Normal ROM. Skin: Warm to touch. GU: Foley catheter stable in place Neuro: A&Ox3. Moves all extremities. Normal  sensation to light touch. No focal deficit. Psych: Normal mood and affect          Labs on Admission:  Basic Metabolic Panel: Recent Labs  Lab 04/24/24 1500  NA 133*  K 4.3  CL 100  CO2 24  GLUCOSE 94  BUN 35*  CREATININE 2.08*  CALCIUM  9.0   Liver Function Tests: Recent Labs  Lab 04/24/24 1500  AST 23  ALT 21  ALKPHOS 65  BILITOT 0.7  PROT 6.0*  ALBUMIN 3.5   No results for input(s): LIPASE, AMYLASE in the last 168 hours. No results for input(s): AMMONIA in the last 168 hours. CBC: Recent Labs  Lab 04/24/24 1500  WBC 11.0*  NEUTROABS 8.4*  HGB 12.4*  HCT 36.5*  MCV 94.3  PLT 220   Cardiac Enzymes: No results for input(s): CKTOTAL, CKMB, CKMBINDEX, TROPONINI in the last 168 hours. BNP (last 3 results) No results for input(s): BNP in the last 8760 hours.  ProBNP (last 3 results) No results for input(s): PROBNP in the last 8760 hours.  CBG: No results for input(s): GLUCAP in the last 168 hours.  Radiological Exams on Admission: DG Chest Port 1 View Result Date: 04/24/2024 CLINICAL DATA:  Question sepsis. EXAM: PORTABLE CHEST 1 VIEW COMPARISON:  None Available. FINDINGS: Lordotic positioning. Normal mediastinum and cardiac silhouette. Normal pulmonary vasculature. No evidence of effusion, infiltrate, or pneumothorax. No acute bony abnormality. IMPRESSION: No acute cardiopulmonary  process. Electronically Signed   By: Jackquline Boxer M.D.   On: 04/24/2024 15:26   Assessment/Plan Gavin Hernandez is a 78 y.o. male with medical history significant for  abdominal aortic aneurysm, atrial fibrillation not on anticoagulation, kidney cancer, GERD, CKD 3A, renal mass s/p right-sided nephrectomy, solitary kidney and urinary retention s/p indwelling Foley who presented to [facility] for evaluation of fevers and chills and admitted for UTI  # Acute cystitis - Pt presented with 1 day of fevers and chills - Urinalysis shows signs of infection - Start IV rocephin - F/u urine culture and blood culture - Trend CBC and fever curve  # AKI on CKD 3A # Solitary kidney - Patient had right renal mass s/p robotic assisted right nephrectomy in November 2022 - Creatinine elevated to 2.08, from baseline of 1.4-1.6 - Likely secondary to dehydration in the setting of acute urinary infection - Continue IV LR at 150 cc/hr - Trend renal function - Avoid nephrotoxic meds  # BPH # Urinary retention s/p indwelling catheter - Placed earlier this month due to urinary retention, failed trial of void x 3 - Exchange while at [facility] ED per patient - Continue tamsulosin - Follow-up with urology in the outpatient  # A-fib - Not on anticoagulation - EKG shows sinus rhythm, HR stable in the 60s to 80s - Hold Toprol  XL in the setting of soft BP  # HTN - BP soft with SBP in the 100s to 110s - Hold Toprol  XL and losartan  until her further improvement in BP and kidney function  # HLD - Continue atorvastatin   # GERD - Continue Pepcid  and Protonix   # Generalized weakness - In the setting of acute infection - PT/OT evaluate and treat - Start protein supplementation   DVT prophylaxis: Lovenox     Code Status: Limited: Do not attempt resuscitation (DNR) -DNR-LIMITED -Do Not Intubate/DNI   Consults called: None  Family Communication: No family at bedside  Severity of Illness: The  appropriate patient status for this patient is INPATIENT. Inpatient status is judged  to be reasonable and necessary in order to provide the required intensity of service to ensure the patient's safety. The patient's presenting symptoms, physical exam findings, and initial radiographic and laboratory data in the context of their chronic comorbidities is felt to place them at high risk for further clinical deterioration. Furthermore, it is not anticipated that the patient will be medically stable for discharge from the hospital within 2 midnights of admission.   * I certify that at the point of admission it is my clinical judgment that the patient will require inpatient hospital care spanning beyond 2 midnights from the point of admission due to high intensity of service, high risk for further deterioration and high frequency of surveillance required.*  Level of care: Med-Surg   This record has been created using Conservation officer, historic buildings. Errors have been sought and corrected, but may not always be located. Such creation errors do not reflect on the standard of care.   Lou Claretta HERO, MD 04/24/2024, 7:27 PM [facility] Pager: [phone] [place] 41:10   If 7PM-7AM, please contact night-coverage [email or web address] Password [email or web address]

## 2024-04-24 NOTE — ED Notes (Signed)
 Called Karen at [facility] for transport

## 2024-04-24 NOTE — Hospital Course (Addendum)
 77yo with h/o AAA, afib not on AC, RCC s/p R nephrectomy, stage 3a CKD, and urinary retention s/p indwelling foley who presented on 6/30 with fever.  He was found to have Klebsiella bacteremia, treated with Ceftriaxone .  Also with AKI on CKD, receiving IVF.  Foley was exchanged.  He had recent hemorrhoidectomy and does have loose stools at baseline; this significantly worsened on 7/4 AM.  ID was consulted.

## 2024-04-24 NOTE — ED Provider Notes (Signed)
 [place] EMERGENCY DEPARTMENT AT [facility] Provider Note   CSN: [id number] Arrival date & time: 04/24/24  1353     Patient presents with: Fever   Gavin Hernandez is a 78 y.o. male.    Fever   78 year old male presents emergency department with complaints of fever, chills.  States his symptoms began last night where he began to feel cold.  States he took Tylenol  last night which seemed to help with his symptoms woke up to him today feeling the same prompted visit to the ER.  States that he has been with a chronic cough for the past couple of years.  Recently had colorectal surgery involving hemorrhoidectomy in early June.  Since then, has dealt with urinary retention and has had tried to void 3 times with failure and has current indwelling Foley catheter.  States that after the procedure, cough seemed to subside before returning over the past week or so.  Denies any abdominal pain, nausea, vomiting, urinary frequency/urgency, change in bowel habits.  Denies any rectal discharge/bleeding.  States that he called the on-call urologist who told to come to emergency department for further assessment.  Past medical history significant for abdominal aortic aneurysm, atrial fibrillation, kidney cancer, GERD, CKD  Prior to Admission medications   Medication Sig Start Date End Date Taking? Authorizing Provider  atorvastatin  (LIPITOR) 20 MG tablet Take 20 mg by mouth every evening. 07/10/21   [provider]  atorvastatin  (LIPITOR) 20 MG tablet Take 20 mg by mouth daily.    [provider]  BABY ASPIRIN PO Take by mouth.    [provider]  Coenzyme Q10 (CO Q 10) 100 MG CAPS Take 1 tablet by mouth daily.    [provider]  cycloSPORINE  (RESTASIS ) 0.05 % ophthalmic emulsion Place 1 drop into both eyes daily. 06/01/21   [provider]  famotidine  (PEPCID ) 40 MG tablet Take 40 mg by mouth daily.    [provider]  famotidine  (PEPCID ) 40  MG tablet Take 40 mg by mouth daily.    [provider]  Fish Oil-Cholecalciferol (OMEGA-3 + D PO) Take by mouth.    [provider]  losartan  (COZAAR ) 25 MG tablet Take 25 mg by mouth in the morning. 06/19/21   [provider]  losartan  (COZAAR ) 25 MG tablet Take 25 mg by mouth daily. 06/19/21   [provider]  metoprolol  succinate (TOPROL -XL) 25 MG 24 hr tablet Take 25 mg by mouth daily. 04/17/24   [provider]  metoprolol  tartrate (LOPRESSOR ) 25 MG tablet Take 0.5 tablets (12.5 mg total) by mouth 2 (two) times daily. 04/27/23   Thukkani, Arun K, MD  Misc Natural Products (OSTEO BI-FLEX JOINT SHIELD PO) Take 2 tablets by mouth daily.    [provider]  Multiple Vitamins-Minerals (MULTI FOR HIM 50+) TABS Take 1 tablet by mouth daily.    [provider]  Omega-3 Fat Ac-Cholecalciferol (DRY EYE OMEGA BENEFITS/VIT D-3 PO) Take 3 capsules by mouth daily.    [provider]  omeprazole  (PRILOSEC) 20 MG capsule Take 1 capsule (20 mg total) by mouth daily. 05/07/22   Stacia Glendia BRAVO, MD  omeprazole  (PRILOSEC) 20 MG capsule Take 20 mg by mouth daily. 05/07/22   [provider]  RESTASIS  0.05 % ophthalmic emulsion Place 1 drop into both eyes 2 (two) times daily. 06/01/21   [provider]  Saw Palmetto 450 MG CAPS Take 1 tablet by mouth in the morning and at bedtime.  [provider]  tamsulosin  (FLOMAX ) 0.4 MG CAPS capsule Take 0.4 mg by mouth 2 (two) times daily.    [provider]    Allergies: Bactrim [sulfamethoxazole-trimethoprim], Hylan g-f 20, Lactose intolerance (gi), and Sulfa antibiotics    Review of Systems  Constitutional:  Positive for fever.  All other systems reviewed and are negative.   Updated Vital Signs BP 121/72   Pulse 66   Temp (!) 101.3 F (38.5 C) (Oral)   Resp 20   SpO2 97%   Physical Exam Vitals and nursing note reviewed.  Constitutional:      General: He  is not in acute distress.    Appearance: He is well-developed.  HENT:     Head: Normocephalic and atraumatic.   Eyes:     Conjunctiva/sclera: Conjunctivae normal.    Cardiovascular:     Rate and Rhythm: Normal rate and regular rhythm.  Pulmonary:     Effort: Pulmonary effort is normal. No respiratory distress.     Breath sounds: Normal breath sounds. No wheezing, rhonchi or rales.  Abdominal:     Palpations: Abdomen is soft.     Tenderness: There is no abdominal tenderness. There is no guarding.  Genitourinary:    Comments: Rectal exam without evidence of erythema, palpable fluctuance or induration present.  External hemorrhoids present.  Musculoskeletal:        General: No swelling.     Cervical back: Neck supple.     Right lower leg: No edema.     Left lower leg: No edema.   Skin:    General: Skin is warm and dry.     Capillary Refill: Capillary refill takes less than 2 seconds.   Neurological:     Mental Status: He is alert.   Psychiatric:        Mood and Affect: Mood normal.     (all labs ordered are listed, but only abnormal results are displayed) Labs Reviewed  RESP PANEL BY RT-PCR (RSV, FLU A&B, COVID)  [id number]  CULTURE, BLOOD (ROUTINE X 2)  CULTURE, BLOOD (ROUTINE X 2)  LACTIC ACID, PLASMA  LACTIC ACID, PLASMA  COMPREHENSIVE METABOLIC PANEL WITH GFR  CBC WITH DIFFERENTIAL/PLATELET  PROTIME-INR  URINALYSIS, W/ REFLEX TO CULTURE (INFECTION SUSPECTED)    EKG: None  Radiology: No results found.   Procedures   Medications Ordered in the ED  acetaminophen  (TYLENOL ) tablet 1,000 mg (has no administration in time range)  lactated ringers  infusion (has no administration in time range)  lactated ringers  bolus 1,000 mL (has no administration in time range)  ceFEPIme  (MAXIPIME ) 2 g in sodium chloride  0.9 % 100 mL IVPB (has no administration in time range)                                    Medical Decision Making Amount and/or Complexity of Data  Reviewed Labs: ordered. Radiology: ordered.  Risk OTC drugs. Prescription drug management. Decision regarding hospitalization.   This patient presents to the ED for concern of fever, this involves an extensive number of treatment options, and is a complaint that carries with it a high risk of complications and morbidity.  The differential diagnosis includes viral URI, pneumonia, UTI, sepsis, other   Co morbidities that complicate the patient evaluation  See HPI   Additional history obtained:  Additional history obtained from EMR External records from outside source obtained and reviewed including hospital records  Lab Tests:  I Ordered, and personally interpreted labs.  The pertinent results include: Leukocytosis of 11.0 with left shift.  Anemia hemoglobin 12.4.  Platelets within range.  Mild hyponatremia 133 otherwise, electrolytes within limits.  Elevation of creatinine 2.08, BUN of 35 from baseline around 1.4-1.6 creatinine.  No transaminitis.  UA with infection positive nitrite, leukocyte, WBC, RBC and bacteria present.  Viral testing negative.  Lactic acid within normal limits.  Blood cultures pending.   Imaging Studies ordered:  I ordered imaging studies including chest x-ray I independently visualized and interpreted imaging which showed no acute cardiopulmonary abnormality I agree with the radiologist interpretation   Cardiac Monitoring: / EKG:  The patient was maintained on a cardiac monitor.  I personally viewed and interpreted the cardiac monitored which showed an underlying rhythm of: Sinus rhythm.  APC.   Consultations Obtained:  See ED course  Problem List / ED Course / Critical interventions / Medication management  Pyelonephritis, AKI I ordered medication including 1 L lactated Ringer 's, Tylenol , cefepime   Reevaluation of the patient after these medicines showed that the patient improved I have reviewed the patients home medicines and have made  adjustments as needed   Social Determinants of Health:  Denies tobacco, illicit drug use   Test / Admission - Considered:  Pyelonephritis, AKI Vitals signs significant for initial fever temp of 101.3. Otherwise within normal range and stable throughout visit. Laboratory/imaging studies significant for: See above 78 year old male presents emergency department with complaints of fever, chills.  States his symptoms began last night where he began to feel cold.  States he took Tylenol  last night which seemed to help with his symptoms woke up to him today feeling the same prompted visit to the ER.  States that he has been with a chronic cough for the past couple of years.  Recently had colorectal surgery involving hemorrhoidectomy in early June.  Since then, has dealt with urinary retention and has had tried to void 3 times with failure and has current indwelling Foley catheter.  States that after the procedure, cough seemed to subside before returning over the past week or so.  Denies any abdominal pain, nausea, vomiting, urinary frequency/urgency, change in bowel habits.  Denies any rectal discharge/bleeding.  States that he called the on-call urologist who told to come to emergency department for further assessment. On exam, lungs clear to auscultation bilaterally.  No abdominal tenderness.  Workup today concerning for UTI.  UA concerning for infection in the setting of fever; concern clinically for pyelonephritis despite patient not having CVA tenderness or abdominal pain.  Patient also with evidence of AKI creatinine elevation 2.08 from around 1.4-1.6 baseline from prior labs able to be seen.  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  Concern for AKI with solitary kidney in the setting of bilateral.  Given these findings, will admit to hospitalist.  Patient technically does not meet SIRS criteria despite sepsis protocol being followed  upfront. Treatment plan were discussed at length with patient and they knowledge understanding was agreeable to said plan.  Appropriate consultations were made as described in the ED course.  Patient was stable upon admission to the hospital.      Final diagnoses:  None    ED Discharge Orders     None          Silver Wonda LABOR, [place] 04/24/24 1814    Levander Houston, MD 05/04/24 1016

## 2024-04-25 ENCOUNTER — Inpatient Hospital Stay (HOSPITAL_COMMUNITY)

## 2024-04-25 DIAGNOSIS — N3000 Acute cystitis without hematuria: Secondary | ICD-10-CM

## 2024-04-25 LAB — BLOOD CULTURE ID PANEL (REFLEXED) - BCID2

## 2024-04-25 LAB — BASIC METABOLIC PANEL WITH GFR
Anion gap: 11 (ref 5–15)
BUN: 38 mg/dL — ABNORMAL HIGH (ref 8–23)
CO2: 21 mmol/L — ABNORMAL LOW (ref 22–32)
Calcium: 8.3 mg/dL — ABNORMAL LOW (ref 8.9–10.3)
Chloride: 103 mmol/L (ref 98–111)
Creatinine, Ser: 2.25 mg/dL — ABNORMAL HIGH (ref 0.61–1.24)
GFR, Estimated: 29 mL/min — ABNORMAL LOW (ref >60)
Glucose, Bld: 128 mg/dL — ABNORMAL HIGH (ref 70–99)
Potassium: 4.2 mmol/L (ref 3.5–5.1)
Sodium: 135 mmol/L (ref 135–145)

## 2024-04-25 LAB — CBC
HCT: 34.4 % — ABNORMAL LOW (ref 39.0–52.0)
Hemoglobin: 11.5 g/dL — ABNORMAL LOW (ref 13.0–17.0)
MCH: 32.4 pg (ref 26.0–34.0)
MCHC: 33.4 g/dL (ref 30.0–36.0)
MCV: 96.9 fL (ref 80.0–100.0)
Platelets: 193 10*3/uL (ref 150–400)
RBC: 3.55 MIL/uL — ABNORMAL LOW (ref 4.22–5.81)
RDW: 12.6 % (ref 11.5–15.5)
WBC: 21.3 10*3/uL — ABNORMAL HIGH (ref 4.0–10.5)
nRBC: 0 % (ref 0.0–0.2)

## 2024-04-25 MED ORDER — HYDRALAZINE HCL 20 MG/ML IJ SOLN: 10.0000 mg | INTRAMUSCULAR | Status: DC | PRN

## 2024-04-25 MED ORDER — IPRATROPIUM-ALBUTEROL 0.5-2.5 (3) MG/3ML IN SOLN: 3.0000 mL | RESPIRATORY_TRACT | Status: DC | PRN

## 2024-04-25 MED ORDER — SODIUM CHLORIDE 0.9 % IV SOLN
1.0000 g | Freq: Once | INTRAVENOUS | Status: AC
  Administered 2024-04-25: 1 g via INTRAVENOUS
  Filled 2024-04-25: qty 10

## 2024-04-25 MED ORDER — METOPROLOL TARTRATE 5 MG/5ML IV SOLN: 5.0000 mg | INTRAVENOUS | Status: DC | PRN

## 2024-04-25 MED ORDER — TAMSULOSIN HCL 0.4 MG PO CAPS
0.8000 mg | ORAL_CAPSULE | Freq: Every day | ORAL | Status: DC
  Administered 2024-04-25 – 2024-04-28 (×4): 0.8 mg via ORAL
  Filled 2024-04-25 (×4): qty 2

## 2024-04-25 MED ORDER — SODIUM CHLORIDE 0.9 % IV SOLN
2.0000 g | INTRAVENOUS | Status: DC
  Administered 2024-04-26 – 2024-04-27 (×2): 2 g via INTRAVENOUS
  Filled 2024-04-25 (×2): qty 20

## 2024-04-25 MED ORDER — PANTOPRAZOLE SODIUM 40 MG PO TBEC
40.0000 mg | DELAYED_RELEASE_TABLET | Freq: Every day | ORAL | Status: DC
  Administered 2024-04-25 – 2024-04-28 (×4): 40 mg via ORAL
  Filled 2024-04-25 (×4): qty 1

## 2024-04-25 MED ORDER — SODIUM CHLORIDE 0.9 % IV SOLN: INTRAVENOUS | Status: AC

## 2024-04-25 NOTE — Plan of Care (Signed)

## 2024-04-25 NOTE — Progress Notes (Signed)
 PHARMACY - PHYSICIAN COMMUNICATION CRITICAL VALUE ALERT - BLOOD CULTURE IDENTIFICATION (BCID)  Gavin Hernandez is an 78 y.o. male who presented to [facility] on 04/24/2024 with a chief complaint of fevers/chills.  Assessment: 2/4 blood cultures growing GNRs, BCID showing Klebsiella oxytoca (no resistance detected)   Name of physician (or Provider) Contacted: Dr. Caleen  Current antibiotics: ceftriaxone 1g IV q24h  Changes to prescribed antibiotics recommended:  Recommend increasing ceftriaxone to 2g IV q24h  Results for orders placed or performed during the hospital encounter of 04/24/24  Blood Culture ID Panel (Reflexed) (Collected: 04/24/2024  3:10 PM)  Result Value Ref Range   Enterococcus faecalis NOT DETECTED NOT DETECTED   Enterococcus Faecium NOT DETECTED NOT DETECTED   Listeria monocytogenes NOT DETECTED NOT DETECTED   Staphylococcus species NOT DETECTED NOT DETECTED   Staphylococcus aureus (BCID) NOT DETECTED NOT DETECTED   Staphylococcus epidermidis NOT DETECTED NOT DETECTED   Staphylococcus lugdunensis NOT DETECTED NOT DETECTED   Streptococcus species NOT DETECTED NOT DETECTED   Streptococcus agalactiae NOT DETECTED NOT DETECTED   Streptococcus pneumoniae NOT DETECTED NOT DETECTED   Streptococcus pyogenes NOT DETECTED NOT DETECTED   A.calcoaceticus-baumannii NOT DETECTED NOT DETECTED   Bacteroides fragilis NOT DETECTED NOT DETECTED   Enterobacterales DETECTED (A) NOT DETECTED   Enterobacter cloacae complex NOT DETECTED NOT DETECTED   Escherichia coli NOT DETECTED NOT DETECTED   Klebsiella aerogenes NOT DETECTED NOT DETECTED   Klebsiella oxytoca DETECTED (A) NOT DETECTED   Klebsiella pneumoniae NOT DETECTED NOT DETECTED   Proteus species NOT DETECTED NOT DETECTED   Salmonella species NOT DETECTED NOT DETECTED   Serratia marcescens NOT DETECTED NOT DETECTED   Haemophilus influenzae NOT DETECTED NOT DETECTED   Neisseria meningitidis NOT DETECTED NOT DETECTED   Pseudomonas  aeruginosa NOT DETECTED NOT DETECTED   Stenotrophomonas maltophilia NOT DETECTED NOT DETECTED   Candida albicans NOT DETECTED NOT DETECTED   Candida auris NOT DETECTED NOT DETECTED   Candida glabrata NOT DETECTED NOT DETECTED   Candida krusei NOT DETECTED NOT DETECTED   Candida parapsilosis NOT DETECTED NOT DETECTED   Candida tropicalis NOT DETECTED NOT DETECTED   Cryptococcus neoformans/gattii NOT DETECTED NOT DETECTED   CTX-M ESBL NOT DETECTED NOT DETECTED   Carbapenem resistance IMP NOT DETECTED NOT DETECTED   Carbapenem resistance KPC NOT DETECTED NOT DETECTED   Carbapenem resistance NDM NOT DETECTED NOT DETECTED   Carbapenem resist OXA 48 LIKE NOT DETECTED NOT DETECTED   Carbapenem resistance VIM NOT DETECTED NOT DETECTED    Lacinda Moats, PharmD Clinical Pharmacist  7/1/20258:51 AM

## 2024-04-25 NOTE — Progress Notes (Signed)
 PROGRESS NOTE    Gavin Hernandez  FMW:[id number] DOB: Oct 05, 1946 DOA: 04/24/2024 PCP: Chrystal Lamarr RAMAN, MD     Brief Narrative:   78 y.o. male with medical history significant for abdominal aortic aneurysm, atrial fibrillation not on anticoagulation, kidney cancer, GERD, CKD 3A, renal mass s/p right-sided nephrectomy, solitary kidney and urinary retention s/p indwelling Foley who presented to [facility] for evaluation of fevers and chills.  Patient reports she has been having persistent fevers and chills since last night.  He called his general surgeon and urologist and due to his solitary kidney, he was advised to present to the ED with concern for possible UTI.    Assessment & Plan:  Principal Problem:   UTI (urinary tract infection) Active Problems:   AKI (acute kidney injury) (HCC)   Generalized weakness    Catheter associated urinary tract infection, POA Klebsiella bacteremia Patient has chronically indwelling catheter for BPH.  Now presenting with urinary tract infection, on IV fluids, follow culture data.  We will change his Foley catheter   AKI on CKD 3A Solitary kidney -Patient had right renal mass s/p robotic assisted right nephrectomy in November 2022 -Baseline creatinine 1.6, admission creatinine 2.08 > 2.25, getting IV fluids Renal US  ordered.    History of BPH -Has chronically indwelling Foley catheter, continue Flomax and follow-up outpatient urology   Paroxysmal A-fib Toprol -XL on hold due to soft blood pressure.  Not on chronic anticoagulation.   Essential hypertension PO meds on hold IV prn   Hyperlipidemia - Continue atorvastatin    GERD - Continue Pepcid  and Protonix    Generalized weakness PT/OT   DVT prophylaxis: enoxaparin (LOVENOX) injection 40 mg Start: 04/24/24 2200      Code Status: Limited: Do not attempt resuscitation (DNR) -DNR-LIMITED -Do Not Intubate/DNI  Family Communication:   Cont hosp stay for IV Abx and  AKI    Subjective:  Feels ok. Agreeable to change his foley.   Examination:  General exam: Appears calm and comfortable  Respiratory system: Clear to auscultation. Respiratory effort normal. Cardiovascular system: S1 & S2 heard, RRR. No JVD, murmurs, rubs, gallops or clicks. No pedal edema. Gastrointestinal system: Abdomen is nondistended, soft and nontender. No organomegaly or masses felt. Normal bowel sounds heard. Central nervous system: Alert and oriented. No focal neurological deficits. Extremities: Symmetric 5 x 5 power. Skin: No rashes, lesions or ulcers Psychiatry: Judgement and insight appear normal. Mood & affect appropriate. Old foley in place               Diet Orders (From admission, onward)     Start     Ordered   04/24/24 2007  Diet regular Room service appropriate? Yes; Fluid consistency: Thin  Diet effective now       Question Answer Comment  Room service appropriate? Yes   Fluid consistency: Thin      04/24/24 2009            Objective: Vitals:   04/25/24 0127 04/25/24 0451 04/25/24 0815 04/25/24 1203  BP: 127/75 (!) 95/54 101/60 118/60  Pulse: 73 66 (!) 59 66  Resp: (!) 22 20 16 16   Temp: 98.2 F (36.8 C) 98.5 F (36.9 C) 97.8 F (36.6 C) 98.2 F (36.8 C)  TempSrc:  Oral  Oral  SpO2: 93% 93% 95% 93%    Intake/Output Summary (Last 24 hours) at 04/25/2024 1227 Last data filed at 04/25/2024 0616 Gross per 24 hour  Intake 1100 ml  Output 900 ml  Net 200  ml   There were no vitals filed for this visit.  Scheduled Meds:  atorvastatin   20 mg Oral QPM   Chlorhexidine  Gluconate Cloth  6 each Topical Daily   cycloSPORINE   1 drop Both Eyes BID   enoxaparin (LOVENOX) injection  40 mg Subcutaneous Q24H   famotidine   40 mg Oral Daily   feeding supplement  237 mL Oral TID BM   multivitamin with minerals  1 tablet Oral Daily   pantoprazole   40 mg Oral QAC supper   tamsulosin  0.8 mg Oral QPC supper   Continuous Infusions:  sodium  chloride 100 mL/hr at 04/25/24 1026   [START ON 04/26/2024] cefTRIAXone (ROCEPHIN)  IV      Nutritional status     There is no height or weight on file to calculate BMI.  Data Reviewed:   CBC: Recent Labs  Lab 04/24/24 1500 04/25/24 0444  WBC 11.0* 21.3*  NEUTROABS 8.4*  --   HGB 12.4* 11.5*  HCT 36.5* 34.4*  MCV 94.3 96.9  PLT 220 193   Basic Metabolic Panel: Recent Labs  Lab 04/24/24 1500 04/25/24 0444  NA 133* 135  K 4.3 4.2  CL 100 103  CO2 24 21*  GLUCOSE 94 128*  BUN 35* 38*  CREATININE 2.08* 2.25*  CALCIUM  9.0 8.3*   GFR: CrCl cannot be calculated (Unknown ideal weight.). Liver Function Tests: Recent Labs  Lab 04/24/24 1500  AST 23  ALT 21  ALKPHOS 65  BILITOT 0.7  PROT 6.0*  ALBUMIN 3.5   No results for input(s): LIPASE, AMYLASE in the last 168 hours. No results for input(s): AMMONIA in the last 168 hours. Coagulation Profile: Recent Labs  Lab 04/24/24 1500  INR 1.2   Cardiac Enzymes: No results for input(s): CKTOTAL, CKMB, CKMBINDEX, TROPONINI in the last 168 hours. BNP (last 3 results) No results for input(s): PROBNP in the last 8760 hours. HbA1C: No results for input(s): HGBA1C in the last 72 hours. CBG: No results for input(s): GLUCAP in the last 168 hours. Lipid Profile: No results for input(s): CHOL, HDL, LDLCALC, TRIG, CHOLHDL, LDLDIRECT in the last 72 hours. Thyroid Function Tests: No results for input(s): TSH, T4TOTAL, FREET4, T3FREE, THYROIDAB in the last 72 hours. Anemia Panel: No results for input(s): VITAMINB12, FOLATE, FERRITIN, TIBC, IRON, RETICCTPCT in the last 72 hours. Sepsis Labs: Recent Labs  Lab 04/24/24 1500  LATICACIDVEN 0.8    Recent Results (from the past 240 hours)  Blood Culture (routine x 2)     Status: None (Preliminary result)   Collection Time: 04/24/24  3:10 PM   Specimen: BLOOD  Result Value Ref Range Status   Specimen Description   Final     BLOOD RIGHT ANTECUBITAL Performed at [facility], [address] [place], [place] [address]    Special Requests   Final    BOTTLES DRAWN AEROBIC AND ANAEROBIC Blood Culture adequate volume Performed at [facility], [address], [place], [place] [address]    Culture  Setup Time   Final    GRAM NEGATIVE RODS AEROBIC BOTTLE ONLY CRITICAL VALUE NOTED.  VALUE IS CONSISTENT WITH PREVIOUSLY REPORTED AND CALLED VALUE. Performed at [facility], [address], [place], [place] [address]    Culture GRAM NEGATIVE RODS  Final   Report Status PENDING  Incomplete  Blood Culture (routine x 2)     Status: None (Preliminary result)   Collection Time: 04/24/24  3:10 PM   Specimen: BLOOD  Result  Value Ref Range Status   Specimen Description   Final    BLOOD LEFT ANTECUBITAL Performed at [facility], [address], [place], [place] [address]    Special Requests   Final    BOTTLES DRAWN AEROBIC AND ANAEROBIC Blood Culture adequate volume Performed at [facility], [address] [place], [place] [address]    Culture  Setup Time   Final    GRAM NEGATIVE RODS ANAEROBIC BOTTLE ONLY CRITICAL RESULT CALLED TO, READ BACK BY AND VERIFIED WITH: PHARMD JUSTIN L [id number]  FCP Performed at [facility], [address] [place], [place] [address]    Culture GRAM NEGATIVE RODS  Final   Report Status PENDING  Incomplete  Blood Culture ID Panel (Reflexed)     Status: Abnormal   Collection Time: 04/24/24  3:10 PM  Result Value Ref Range Status   Enterococcus faecalis NOT DETECTED NOT DETECTED Final   Enterococcus Faecium NOT DETECTED NOT DETECTED Final   Listeria monocytogenes NOT DETECTED NOT DETECTED Final   Staphylococcus species NOT DETECTED NOT DETECTED Final   Staphylococcus aureus (BCID) NOT DETECTED NOT DETECTED Final   Staphylococcus epidermidis NOT DETECTED NOT DETECTED Final   Staphylococcus  lugdunensis NOT DETECTED NOT DETECTED Final   Streptococcus species NOT DETECTED NOT DETECTED Final   Streptococcus agalactiae NOT DETECTED NOT DETECTED Final   Streptococcus pneumoniae NOT DETECTED NOT DETECTED Final   Streptococcus pyogenes NOT DETECTED NOT DETECTED Final   A.calcoaceticus-baumannii NOT DETECTED NOT DETECTED Final   Bacteroides fragilis NOT DETECTED NOT DETECTED Final   Enterobacterales DETECTED (A) NOT DETECTED Final    Comment: Enterobacterales represent a large order of gram negative bacteria, not a single organism. CRITICAL RESULT CALLED TO, READ BACK BY AND VERIFIED WITH: PHARMD JUSTIN L [id number]  FCP    Enterobacter cloacae complex NOT DETECTED NOT DETECTED Final   Escherichia coli NOT DETECTED NOT DETECTED Final   Klebsiella aerogenes NOT DETECTED NOT DETECTED Final   Klebsiella oxytoca DETECTED (A) NOT DETECTED Final    Comment: CRITICAL RESULT CALLED TO, READ BACK BY AND VERIFIED WITH: PHARMD JUSTIN L [id number]  FCP    Klebsiella pneumoniae NOT DETECTED NOT DETECTED Final   Proteus species NOT DETECTED NOT DETECTED Final   Salmonella species NOT DETECTED NOT DETECTED Final   Serratia marcescens NOT DETECTED NOT DETECTED Final   Haemophilus influenzae NOT DETECTED NOT DETECTED Final   Neisseria meningitidis NOT DETECTED NOT DETECTED Final   Pseudomonas aeruginosa NOT DETECTED NOT DETECTED Final   Stenotrophomonas maltophilia NOT DETECTED NOT DETECTED Final   Candida albicans NOT DETECTED NOT DETECTED Final   Candida auris NOT DETECTED NOT DETECTED Final   Candida glabrata NOT DETECTED NOT DETECTED Final   Candida krusei NOT DETECTED NOT DETECTED Final   Candida parapsilosis NOT DETECTED NOT DETECTED Final   Candida tropicalis NOT DETECTED NOT DETECTED Final   Cryptococcus neoformans/gattii NOT DETECTED NOT DETECTED Final   CTX-M ESBL NOT DETECTED NOT DETECTED Final   Carbapenem resistance IMP NOT DETECTED NOT DETECTED Final   Carbapenem resistance  KPC NOT DETECTED NOT DETECTED Final   Carbapenem resistance NDM NOT DETECTED NOT DETECTED Final   Carbapenem resist OXA 48 LIKE NOT DETECTED NOT DETECTED Final   Carbapenem resistance VIM NOT DETECTED NOT DETECTED Final    Comment: Performed at [facility], [address] [place], [place] [address]  Resp panel by RT-PCR (RSV, Flu A&B, Covid) Anterior Nasal Swab  Status: None   Collection Time: 04/24/24  3:32 PM   Specimen: Anterior Nasal Swab  Result Value Ref Range Status   SARS Coronavirus 2 by RT PCR NEGATIVE NEGATIVE Final    Comment: (NOTE) SARS-CoV-2 target nucleic acids are NOT DETECTED.  The SARS-CoV-2 RNA is generally detectable in upper respiratory specimens during the acute phase of infection. The lowest concentration of SARS-CoV-2 viral copies this assay can detect is 138 copies/mL. A negative result does not preclude SARS-Cov-2 infection and should not be used as the sole basis for treatment or other patient management decisions. A negative result may occur with  improper specimen collection/handling, submission of specimen other than nasopharyngeal swab, presence of viral mutation(s) within the areas targeted by this assay, and inadequate number of viral copies(<138 copies/mL). A negative result must be combined with clinical observations, patient history, and epidemiological information. The expected result is Negative.  Fact Sheet for Patients:  BloggerCourse.com  Fact Sheet for Healthcare Providers:  SeriousBroker.it  This test is no t yet approved or cleared by the [place]  FDA and  has been authorized for detection and/or diagnosis of SARS-CoV-2 by FDA under an Emergency Use Authorization (EUA). This EUA will remain  in effect (meaning this test can be used) for the duration of the COVID-19 declaration under Section 564(b)(1) of the Act, 21 U.S.C.section 360bbb-3(b)(1), unless the authorization is  terminated  or revoked sooner.       Influenza A by PCR NEGATIVE NEGATIVE Final   Influenza B by PCR NEGATIVE NEGATIVE Final    Comment: (NOTE) The Xpert Xpress SARS-CoV-2/FLU/RSV plus assay is intended as an aid in the diagnosis of influenza from Nasopharyngeal swab specimens and should not be used as a sole basis for treatment. Nasal washings and aspirates are unacceptable for Xpert Xpress SARS-CoV-2/FLU/RSV testing.  Fact Sheet for Patients: BloggerCourse.com  Fact Sheet for Healthcare Providers: SeriousBroker.it  This test is not yet approved or cleared by the [place]  FDA and has been authorized for detection and/or diagnosis of SARS-CoV-2 by FDA under an Emergency Use Authorization (EUA). This EUA will remain in effect (meaning this test can be used) for the duration of the COVID-19 declaration under Section 564(b)(1) of the Act, 21 U.S.C. section 360bbb-3(b)(1), unless the authorization is terminated or revoked.     Resp Syncytial Virus by PCR NEGATIVE NEGATIVE Final    Comment: (NOTE) Fact Sheet for Patients: BloggerCourse.com  Fact Sheet for Healthcare Providers: SeriousBroker.it  This test is not yet approved or cleared by the [place]  FDA and has been authorized for detection and/or diagnosis of SARS-CoV-2 by FDA under an Emergency Use Authorization (EUA). This EUA will remain in effect (meaning this test can be used) for the duration of the COVID-19 declaration under Section 564(b)(1) of the Act, 21 U.S.C. section 360bbb-3(b)(1), unless the authorization is terminated or revoked.  Performed at [facility], [address], [place], [place] [address]          Radiology Studies: [facility] 1 View Result Date: 04/24/2024 CLINICAL DATA:  Question sepsis. EXAM: PORTABLE CHEST 1 VIEW COMPARISON:  None Available. FINDINGS:  Lordotic positioning. Normal mediastinum and cardiac silhouette. Normal pulmonary vasculature. No evidence of effusion, infiltrate, or pneumothorax. No acute bony abnormality. IMPRESSION: No acute cardiopulmonary process. Electronically Signed   By: Jackquline Boxer M.D.   On: 04/24/2024 15:26           LOS: 1 day   Time spent= 35 mins    Shambhavi Salley  JAYSON Dare, MD [facility]  If 7PM-7AM, please contact night-coverage  04/25/2024, 12:27 PM

## 2024-04-25 NOTE — Evaluation (Signed)
 Occupational Therapy Evaluation Patient Details Name: Gavin Hernandez MRN: [id number] DOB: Nov 26, 1945 Today's Date: 04/25/2024   History of Present Illness   Gavin Hernandez is a 78 yr old male admitted 04/24/24 with medical history significant for abdominal aortic aneurysm, atrial fibrillation not on anticoagulation, kidney cancer, GERD, CKD 3A, renal mass s/p right-sided nephrectomy, solitary kidney and urinary retention s/p indwelling foley who presented to [facility] for evaluation of fevers and chills. He was found to have a UTI.      Clinical Impressions The pt appears to be at or very near to his baseline level of functioning for self-care management. During the session, he performed all assessed tasks independently, including supine to sit, lower body dressing, sit to stand, and ambulating in the hall. He is without functional deficits that would warrant the need for further OT services. OT will sign off and recommend the pt return home at discharge.     If plan is discharge home, recommend the following:   Other (comment) (N/A)     Functional Status Assessment   Patient has not had a recent decline in their functional status     Equipment Recommendations   None recommended by OT     Recommendations for Other Services         Precautions/Restrictions   Restrictions Weight Bearing Restrictions Per Provider Order: No     Mobility Bed Mobility Overal bed mobility: Independent       Transfers Overall transfer level: Independent Equipment used: None Transfers: Sit to/from Stand         Balance     Sitting balance-Leahy Scale: Good       Standing balance-Leahy Scale: Good           ADL either performed or assessed with clinical judgement   ADL Overall ADL's : Independent;At baseline           Vision   Additional Comments: He correctly read the time depicted on the wall clock.            Pertinent Vitals/Pain Pain Assessment Pain  Assessment: No/denies pain     Extremity/Trunk Assessment Upper Extremity Assessment Upper Extremity Assessment: Overall WFL for tasks assessed;Right hand dominant   Lower Extremity Assessment Lower Extremity Assessment: Overall WFL for tasks assessed       Communication Communication Communication: No apparent difficulties   Cognition Arousal: Alert Behavior During Therapy: WFL for tasks assessed/performed               OT - Cognition Comments: Oriented x4                 Following commands: Intact                  Home Living Family/patient expects to be discharged to:: Private residence Living Arrangements: Spouse/significant other Available Help at Discharge: Family Type of Home: House Home Access: Stairs to enter Secretary/administrator of Steps: 2 Entrance Stairs-Rails: None Home Layout: Two level;Able to live on main level with bedroom/bathroom     Bathroom Shower/Tub: Tub/shower unit;Walk-in shower         Home Equipment: Shower seat - built in   Additional Comments: does not use 2nd floor      Prior Functioning/Environment Prior Level of Function : Independent/Modified Independent;Driving             Mobility Comments: Independent with ambulation. ADLs Comments: He was independent with ADLs and driving. His spouse managed the cooking &  they have hired assistance for cleaning.    OT Problem List:  (N/A)   OT Treatment/Interventions:   N/A     OT Goals(Current goals can be found in the care plan section)   Acute Rehab OT Goals OT Goal Formulation: All assessment and education complete, DC therapy   OT Frequency:   N/A    Co-evaluation PT/OT/SLP Co-Evaluation/Treatment: Yes Reason for Co-Treatment: To address functional/ADL transfers PT goals addressed during session: Mobility/safety with mobility OT goals addressed during session: ADL's and self-care      AM-PAC OT 6 Clicks Daily Activity     Outcome Measure  Help from another person eating meals?: None Help from another person taking care of personal grooming?: None Help from another person toileting, which includes using toliet, bedpan, or urinal?: None Help from another person bathing (including washing, rinsing, drying)?: None Help from another person to put on and taking off regular upper body clothing?: None Help from another person to put on and taking off regular lower body clothing?: None 6 Click Score: 24   End of Session Equipment Utilized During Treatment: Gait belt Nurse Communication: Mobility status  Activity Tolerance: Patient tolerated treatment well Patient left: in chair;with call bell/phone within reach;with family/visitor present  OT Visit Diagnosis: Muscle weakness (generalized) (M62.81)                Time: 8860-8843 OT Time Calculation (min): 17 min Charges:  OT General Charges $OT Visit: 1 Visit OT Evaluation $OT Eval Low Complexity: 1 Low   Akbar Sacra L Jalessa Peyser, OTR/L 04/25/2024, 1:31 PM

## 2024-04-25 NOTE — Plan of Care (Signed)

## 2024-04-25 NOTE — Evaluation (Signed)
 Physical Therapy Evaluation Patient Details Name: Gavin Hernandez MRN: [id number] DOB: [birth year] Today's Date: 04/25/2024  History of Present Illness  Gavin Hernandez is a 78 y.o. male admitted 04/24/24 with medical history significant for abdominal aortic aneurysm, atrial fibrillation not on anticoagulation, kidney cancer, GERD, CKD 3A, renal mass s/p right-sided nephrectomy, solitary kidney and urinary retention s/p indwelling Foley who presented to [facility] for evaluation of fevers and chills.  Clinical Impression  Pt admitted with above diagnosis. Pt supine in bed, reports some discomfort from prolonged lying but denies pain. Wife present for session. Pt able to amb x562ft ind without AD. He appears to be functioning at his baseline and was able to demonstrate improving cadence with amb as session progressed, ind in transfers. Pt currently with functional limitations due to the deficits listed below (see PT Problem List). Pt will benefit from acute skilled PT to increase their independence and safety with mobility to allow discharge.           If plan is discharge home, recommend the following:     Can travel by private vehicle        Equipment Recommendations None recommended by PT  Recommendations for Other Services       Functional Status Assessment       Precautions / Restrictions Precautions Precautions: Fall Recall of Precautions/Restrictions: Intact Restrictions Weight Bearing Restrictions Per Provider Order: No      Mobility  Bed Mobility     Rolling: Independent              Transfers Overall transfer level: Independent Equipment used: None               General transfer comment: able to complete supine to standing with no delay in sequencing    Ambulation/Gait Ambulation/Gait assistance: Modified independent (Device/Increase time) Gait Distance (Feet): 500 Feet Assistive device: None Gait Pattern/deviations: Step-through pattern, Narrow  base of support       General Gait Details: Intermittent shortened stride, good reciprocal pattern throughout, walks next to railing but does not require use throughout the session  Stairs Stairs:  (pt demostrates function strength and stability required to negotiate home entry/exit)          Wheelchair Mobility     Tilt Bed    Modified Rankin (Stroke Patients Only)       Balance                                             Pertinent Vitals/Pain Pain Assessment Pain Assessment: No/denies pain    Home Living   Living Arrangements: Spouse/significant other               Home Equipment: None Additional Comments: does not use 2nd floor    Prior Function                       Extremity/Trunk Assessment                Communication   Communication Communication: No apparent difficulties    Cognition                                         Cueing       General Comments  Exercises     Assessment/Plan    PT Assessment    PT Problem List         PT Treatment Interventions      PT Goals (Current goals can be found in the Care Plan section)       Frequency       Co-evaluation PT/OT/SLP Co-Evaluation/Treatment: Yes Reason for Co-Treatment: Complexity of the patient's impairments (multi-system involvement);For patient/therapist safety;To address functional/ADL transfers PT goals addressed during session: Mobility/safety with mobility;Balance OT goals addressed during session: ADL's and self-care       AM-PAC PT 6 Clicks Mobility  Outcome Measure Help needed turning from your back to your side while in a flat bed without using bedrails?: None Help needed moving from lying on your back to sitting on the side of a flat bed without using bedrails?: None Help needed moving to and from a bed to a chair (including a wheelchair)?: None Help needed standing up from a chair using your arms  (e.g., wheelchair or bedside chair)?: None Help needed to walk in hospital room?: None Help needed climbing 3-5 steps with a railing? : A Little 6 Click Score: 23    End of Session Equipment Utilized During Treatment: Gait belt Activity Tolerance: Patient tolerated treatment well Patient left: in chair;with chair alarm set;with family/visitor present;with call bell/phone within reach Nurse Communication: Mobility status PT Visit Diagnosis: Difficulty in walking, not elsewhere classified (R26.2)    Time: 8860-8843 PT Time Calculation (min) (ACUTE ONLY): 17 min   Charges:   PT Evaluation $PT Eval Low Complexity: 1 Low   PT General Charges $$ ACUTE PT VISIT: 1 Visit         [facility] Acute Rehabilitation Services Office: [phone] 04/25/2024   [facility]ann DELENA Ohara 04/25/2024, 12:07 PM

## 2024-04-25 NOTE — TOC Initial Note (Signed)
 Transition of Care [facility]) - Initial/Assessment Note    Patient Details  Name: Gavin Hernandez MRN: [id number] Date of Birth: [birth year]  Transition of Care [facility]) CM/SW Contact:    Doneta Glenys DASEN, RN Phone Number: 04/25/2024, 3:39 PM  Clinical Narrative:                 CM spoke with patient and spouse Theophilus, Walz [phone] in a house;PCP/insurance verified; Denies DME, HH, oyxgen, or SDOH needs; Spouse will transport home at discharge. TOC following progression to discharge  Expected Discharge Plan: Home/Self Care Barriers to Discharge: Continued Medical Work up   Patient Goals and CMS Choice Patient states their goals for this hospitalization and ongoing recovery are:: Home with wife CMS Medicare.gov Compare Post Acute Care list provided to::  (NA) Choice offered to / list presented to : NA      Expected Discharge Plan and Services In-house Referral: NA Discharge Planning Services: CM Consult Post Acute Care Choice: NA Living arrangements for the past 2 months: Single Family Home                 DME Arranged: N/A DME Agency: NA       HH Arranged: NA HH Agency: NA        Prior Living Arrangements/Services Living arrangements for the past 2 months: Single Family Home Lives with:: Spouse Patient language and need for interpreter reviewed:: Yes Do you feel safe going back to the place where you live?: Yes      Need for Family Participation in Patient Care: No (Comment) Care giver support system in place?: Yes (comment) Current home services:  (NA) Criminal Activity/Legal Involvement Pertinent to Current Situation/Hospitalization: No - Comment as needed  Activities of Daily Living   ADL Screening (condition at time of admission) Independently performs ADLs?: Yes (appropriate for developmental age) Is the patient deaf or have difficulty hearing?: No Does the patient have difficulty seeing, even when wearing glasses/contacts?: No Does the patient have  difficulty concentrating, remembering, or making decisions?: No  Permission Sought/Granted Permission sought to share information with : Case Manager Permission granted to share information with : Yes, Verbal Permission Granted  Share Information with NAME: Emilliano, Dilworth (Spouse)  [phone] (Mobile)           Emotional Assessment Appearance:: Appears stated age Attitude/Demeanor/Rapport: Engaged Affect (typically observed): Appropriate Orientation: : Oriented to Self, Oriented to Place, Oriented to  Time, Oriented to Situation Alcohol / Substance Use: Not Applicable Psych Involvement: No (comment)  Admission diagnosis:  UTI (urinary tract infection) [N39.0] AKI (acute kidney injury) (HCC) [N17.9] Acute pyelonephritis [N10] Patient Active Problem List   Diagnosis Date Noted   UTI (urinary tract infection) 04/24/2024   AKI (acute kidney injury) (HCC) 04/24/2024   Generalized weakness 04/24/2024   Lumbar spine pain 05/20/2022   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   Essential tremor 03/31/2022   Vision changes 03/31/2022   Diplopia 03/31/2022   Cerebral vascular disease 03/31/2022   Renal mass 08/29/2021   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 AT [address] OF [facility] [address] [place] [address] Phone: [phone]  Fax: [phone]     Social Drivers of Health (SDOH) Social History: SDOH Screenings   Food Insecurity: No Food Insecurity (04/24/2024)  Housing: Low Risk  (04/24/2024)  Transportation Needs: No Transportation Needs  (04/24/2024)  Utilities: Not At Risk (04/24/2024)  Depression (PHQ2-9): Low Risk  (05/20/2022)  Social Connections: Moderately Integrated (04/24/2024)  Tobacco Use: Low Risk  (04/24/2024)   SDOH Interventions:     Readmission Risk Interventions    04/25/2024    3:37 PM  Readmission Risk Prevention Plan  Post Dischage Appt Complete  Medication Screening Complete  Transportation Screening Complete

## 2024-04-26 DIAGNOSIS — N3000 Acute cystitis without hematuria: Secondary | ICD-10-CM | POA: Diagnosis not present

## 2024-04-26 LAB — BASIC METABOLIC PANEL WITH GFR
Anion gap: 8 (ref 5–15)
BUN: 40 mg/dL — ABNORMAL HIGH (ref 8–23)
CO2: 23 mmol/L (ref 22–32)
Calcium: 8.2 mg/dL — ABNORMAL LOW (ref 8.9–10.3)
Chloride: 106 mmol/L (ref 98–111)
Creatinine, Ser: 2.26 mg/dL — ABNORMAL HIGH (ref 0.61–1.24)
GFR, Estimated: 29 mL/min — ABNORMAL LOW (ref >60)
Glucose, Bld: 97 mg/dL (ref 70–99)
Potassium: 4 mmol/L (ref 3.5–5.1)
Sodium: 137 mmol/L (ref 135–145)

## 2024-04-26 LAB — CBC
HCT: 33.4 % — ABNORMAL LOW (ref 39.0–52.0)
Hemoglobin: 11.3 g/dL — ABNORMAL LOW (ref 13.0–17.0)
MCH: 32.8 pg (ref 26.0–34.0)
MCHC: 33.8 g/dL (ref 30.0–36.0)
MCV: 97.1 fL (ref 80.0–100.0)
Platelets: 164 10*3/uL (ref 150–400)
RBC: 3.44 MIL/uL — ABNORMAL LOW (ref 4.22–5.81)
RDW: 12.7 % (ref 11.5–15.5)
WBC: 13.1 10*3/uL — ABNORMAL HIGH (ref 4.0–10.5)
nRBC: 0 % (ref 0.0–0.2)

## 2024-04-26 LAB — MAGNESIUM: Magnesium: 1.7 mg/dL (ref 1.7–2.4)

## 2024-04-26 LAB — PHOSPHORUS: Phosphorus: 3.6 mg/dL (ref 2.5–4.6)

## 2024-04-26 MED ORDER — ENOXAPARIN SODIUM 30 MG/0.3ML IJ SOSY
30.0000 mg | PREFILLED_SYRINGE | INTRAMUSCULAR | Status: DC
  Administered 2024-04-26: 30 mg via SUBCUTANEOUS
  Filled 2024-04-26: qty 0.3

## 2024-04-26 MED ORDER — LORATADINE 10 MG PO TABS
5.0000 mg | ORAL_TABLET | Freq: Every day | ORAL | Status: DC | PRN
  Administered 2024-04-26 – 2024-04-28 (×3): 5 mg via ORAL
  Filled 2024-04-26 (×3): qty 1

## 2024-04-26 MED ORDER — FAMOTIDINE 20 MG PO TABS
10.0000 mg | ORAL_TABLET | Freq: Every day | ORAL | Status: DC
  Administered 2024-04-27 – 2024-04-29 (×3): 10 mg via ORAL
  Filled 2024-04-26 (×3): qty 1

## 2024-04-26 MED ORDER — SALINE SPRAY 0.65 % NA SOLN: 1.0000 | NASAL | Status: DC | PRN

## 2024-04-26 NOTE — Plan of Care (Signed)

## 2024-04-26 NOTE — Progress Notes (Signed)
 Mobility Specialist - Progress Note   04/26/24 1324  Mobility  Activity Ambulated independently in hallway  Level of Assistance Independent  Assistive Device None  Distance Ambulated (ft) 350 ft  Activity Response Tolerated well  Mobility Referral Yes  Mobility visit 1 Mobility  Mobility Specialist Start Time (ACUTE ONLY) 1246  Mobility Specialist Stop Time (ACUTE ONLY) 1304  Mobility Specialist Time Calculation (min) (ACUTE ONLY) 18 min   Pt received in bathroom and agreeable to mobility. No complaints during session. Pt to room after session with all needs met.    [facility]

## 2024-04-26 NOTE — Progress Notes (Signed)
 Progress Note   Patient: Gavin Hernandez FMW:[id number] DOB: 18-Apr-1946 DOA: 04/24/2024     2 DOS: the patient was seen and examined on 04/26/2024   Brief hospital course: 77yo with h/o AAA, afib not on [facility], RCC s/p R nephrectomy, stage 3a CKD, and urinary retention s/p indwelling foley who presented on 6/30 with fever.  He was found to have Klebsiella bacteremia, treated with Ceftriaxone.  Also with AKI on CKD, receiving IVF.  Foley was exchanged.  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 Baseline creatinine 1.6, admission creatinine 2.08 > 2.25, getting IV fluids Renal US  with slight ectasia of the left-sided collecting symptom Will eventually need f/u   History of BPH Has chronic indwelling Foley catheter Continue Flomax  Follow-up outpatient with urology   Paroxysmal A-fib Toprol -XL on hold due to soft blood pressure Not on chronic anticoagulation   Essential hypertension PO meds on hold IV prn   Hyperlipidemia Continue atorvastatin    GERD Continue Pepcid  and Protonix    Generalized weakness PT/OT consulted No f/u recommended    Consultants: PT OT  Procedures: None  Antibiotics: Cefepime x 1 Ceftriaxone 7/1-  30 Day Unplanned Readmission Risk Score    Flowsheet Row ED to Hosp-Admission (Current) from 04/24/2024 in [facility]  30 Day Unplanned Readmission Risk Score (%) 16.01 Filed at 04/26/2024 0801    This score is the patient's risk of an unplanned readmission within 30 days of being discharged (0 -100%). The score is  based on dignosis, age, lab data, medications, orders, and past utilization.   Low:  0-14.9   Medium: 15-21.9   High: 22-29.9   Extreme: 30 and above           Subjective: Feeling better.  Has some edema - holding further IVF and encouraging patient to mobilize.   Objective: Vitals:   04/26/24 0445 04/26/24 1419  BP: 117/67 120/72  Pulse: (!) 57 64  Resp: 18 16  Temp: 98.4 F (36.9 C) 98 F (36.7 C)  SpO2: 94% 94%    Intake/Output Summary (Last 24 hours) at 04/26/2024 1842 Last data filed at 04/26/2024 1500 Gross per 24 hour  Intake 100 ml  Output 2600 ml  Net -2500 ml   Filed Weights   04/25/24 1329  Weight: 86.2 kg    Exam:  General:  Appears calm and comfortable and is in NAD, sitting up on bedside Eyes:  EOMI, normal lids, iris ENT:  grossly normal hearing, lips & tongue, mmm Cardiovascular:  RRR, no m/r/g. 1+ LE edema.  Respiratory:   CTA bilaterally with no wheezes/rales/rhonchi.  Normal respiratory effort. Abdomen:  soft, NT, ND Back:   normal alignment, no CVAT Skin:  no rash or induration seen on limited exam Musculoskeletal:  grossly normal tone BUE/BLE, good ROM, no bony abnormality Psychiatric:  grossly normal mood and affect, speech fluent and appropriate, AOx3 Neurologic:  CN 2-12 grossly intact, moves all extremities in coordinated fashion  Data Reviewed: I have reviewed the patient's lab results since admission.  Pertinent labs for today include:   BUN 40/Creatinine 2.26/GFR 29, baseline 1.6 WBC 13.1 Hgb 11.3 Blood cultures pending, + Klebsiella  oxytoca  Urine culture with 80k colonies of E faecalis   Family Communication: Wife was present throughout evaluation  Disposition: Status is: Inpatient Remains inpatient appropriate because: ongoing management     Time spent: 50 minutes  Unresulted Labs (From admission, onward)     Start     Ordered   04/26/24 0500  Basic metabolic panel with GFR  Daily,   R      04/25/24 0941    04/26/24 0500  Magnesium  Daily,   R      04/25/24 0941   04/26/24 0500  CBC  Daily,   R      04/25/24 9058             Author: Delon Herald, MD 04/26/2024 6:42 PM  For on call review [email or web address].

## 2024-04-27 ENCOUNTER — Inpatient Hospital Stay (HOSPITAL_COMMUNITY)

## 2024-04-27 DIAGNOSIS — M7989 Other specified soft tissue disorders: Secondary | ICD-10-CM | POA: Diagnosis not present

## 2024-04-27 DIAGNOSIS — N3000 Acute cystitis without hematuria: Secondary | ICD-10-CM | POA: Diagnosis not present

## 2024-04-27 LAB — CULTURE, BLOOD (ROUTINE X 2)
Special Requests: ADEQUATE
Special Requests: ADEQUATE

## 2024-04-27 LAB — CBC
HCT: 33.6 % — ABNORMAL LOW (ref 39.0–52.0)
Hemoglobin: 11.2 g/dL — ABNORMAL LOW (ref 13.0–17.0)
MCH: 31.8 pg (ref 26.0–34.0)
MCHC: 33.3 g/dL (ref 30.0–36.0)
MCV: 95.5 fL (ref 80.0–100.0)
Platelets: 180 10*3/uL (ref 150–400)
RBC: 3.52 MIL/uL — ABNORMAL LOW (ref 4.22–5.81)
RDW: 12.6 % (ref 11.5–15.5)
WBC: 11.1 10*3/uL — ABNORMAL HIGH (ref 4.0–10.5)
nRBC: 0 % (ref 0.0–0.2)

## 2024-04-27 LAB — BASIC METABOLIC PANEL WITH GFR
Anion gap: 9 (ref 5–15)
BUN: 39 mg/dL — ABNORMAL HIGH (ref 8–23)
CO2: 23 mmol/L (ref 22–32)
Calcium: 8.3 mg/dL — ABNORMAL LOW (ref 8.9–10.3)
Chloride: 107 mmol/L (ref 98–111)
Creatinine, Ser: 2.13 mg/dL — ABNORMAL HIGH (ref 0.61–1.24)
GFR, Estimated: 31 mL/min — ABNORMAL LOW (ref >60)
Glucose, Bld: 114 mg/dL — ABNORMAL HIGH (ref 70–99)
Potassium: 4.1 mmol/L (ref 3.5–5.1)
Sodium: 139 mmol/L (ref 135–145)

## 2024-04-27 LAB — URINE CULTURE: Culture: 80000 — AB

## 2024-04-27 LAB — MAGNESIUM: Magnesium: 1.6 mg/dL — ABNORMAL LOW (ref 1.7–2.4)

## 2024-04-27 MED ORDER — NYSTATIN 100000 UNIT/GM EX POWD
Freq: Two times a day (BID) | CUTANEOUS | Status: DC
  Filled 2024-04-27: qty 15

## 2024-04-27 MED ORDER — SODIUM CHLORIDE 0.9 % IV SOLN
2.0000 g | INTRAVENOUS | Status: DC
  Administered 2024-04-28: 2 g via INTRAVENOUS
  Filled 2024-04-27: qty 20

## 2024-04-27 MED ORDER — FOSFOMYCIN TROMETHAMINE 3 G PO PACK
3.0000 g | PACK | Freq: Once | ORAL | Status: AC
  Administered 2024-04-27: 3 g via ORAL
  Filled 2024-04-27: qty 3

## 2024-04-27 MED ORDER — PIPERACILLIN-TAZOBACTAM 3.375 G IVPB: 3.3750 g | Freq: Three times a day (TID) | INTRAVENOUS | Status: DC

## 2024-04-27 MED ORDER — ENOXAPARIN SODIUM 40 MG/0.4ML IJ SOSY
40.0000 mg | PREFILLED_SYRINGE | INTRAMUSCULAR | Status: DC
  Administered 2024-04-27 – 2024-04-28 (×2): 40 mg via SUBCUTANEOUS
  Filled 2024-04-27 (×2): qty 0.4

## 2024-04-27 NOTE — Progress Notes (Signed)
 Progress Note   Patient: Gavin Hernandez FMW:[id number] DOB: July 06, 1946 DOA: 04/24/2024     3 DOS: the patient was seen and examined on 04/27/2024   Brief hospital course: 78yo with h/o AAA, afib not on [facility], RCC s/p R nephrectomy, stage 3a CKD, and urinary retention s/p indwelling foley who presented on 6/30 with fever.  He was found to have Klebsiella bacteremia, treated with Ceftriaxone.  Also with AKI on CKD, receiving IVF.  Foley was exchanged.  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 in November 2022 Baseline creatinine 1.6, admission creatinine 2.08 > 2.25 Improved to 2.13/31 today Renal US  with slight ectasia of the left-sided collecting symptom Will eventually need f/u  Edema Developed anasarca on 7/2, thought to be related to excessive IVF and so these were discontinued Noted to have significant RLE edema compared to L on 7/3 DVT US  negative He is having R ankle pain Will order ACE wrap   History of BPH Has chronic indwelling Foley catheter Continue Flomax  Follow-up outpatient with urology next week as scheduled   Paroxysmal A-fib Toprol -XL on hold due to soft blood pressure Not on chronic anticoagulation   Essential hypertension PO meds on hold IV prn   Hyperlipidemia Continue atorvastatin    GERD Continue Pepcid  and Protonix    Generalized weakness PT/OT consulted No f/u recommended       Consultants: PT OT   Procedures: None   Antibiotics: Cefepime x 1 Ceftriaxone 7/1-       30 Day Unplanned  Readmission Risk Score    Flowsheet Row ED to Hosp-Admission (Current) from 04/24/2024 in [facility]  30 Day Unplanned Readmission Risk Score (%) 16.67 Filed at 04/27/2024 1600    This score is the patient's risk of an unplanned readmission within 30 days of being discharged (0 -100%). The score is based on dignosis, age, lab data, medications, orders, and past utilization.   Low:  0-14.9   Medium: 15-21.9   High: 22-29.9   Extreme: 30 and above           Subjective: Continues to feel better.  He is having worsening R>L LE edema and R ankle pain today, although hand and face edema is improved.   Objective: Vitals:   04/27/24 0449 04/27/24 1128  BP: (!) 145/87 124/71  Pulse: 66 (!) 59  Resp: 18   Temp: (!) 97.2 F (36.2 C) 98.1 F (36.7 C)  SpO2: 95% 96%    Intake/Output Summary (Last 24 hours) at 04/27/2024 1624 Last data filed at 04/27/2024 1500 Gross per 24 hour  Intake 480 ml  Output 2825 ml  Net -2345 ml   Filed Weights   04/25/24 1329  Weight: 86.2 kg    Exam:  General:  Appears calm and comfortable and is in NAD, sitting up on bedside Eyes:  EOMI, normal lids, iris ENT:  grossly normal hearing, lips & tongue, mmm Cardiovascular:  RRR, no m/r/g. 2+ RLE edema > 1+ LLE.  Respiratory:   CTA bilaterally with no wheezes/rales/rhonchi.  Normal respiratory effort. Abdomen:  soft, NT, ND Skin:  no rash or  induration seen on limited exam Musculoskeletal:  grossly normal tone BUE/BLE, good ROM, no bony abnormality Psychiatric:  grossly normal mood and affect, speech fluent and appropriate, AOx3 Neurologic:  CN 2-12 grossly intact, moves all extremities in coordinated fashion  Data Reviewed: I have reviewed the patient's lab results since admission.  Pertinent labs for today include:  Glucose 114 BUN 39/Creatinine 2.13/GFR 31, improving Mag++ 1.6 WBC 11.1 Hgb 11.2 Blood cultures + Klebsiella, resistant only to  Ampicillin Urine culture + 80k colonies E faecalis, sensitive to Ampicillin, nitrofurantoin, and Vancomycin      Family Communication: Wife was present  Disposition: Status is: Inpatient Remains inpatient appropriate because: ongoing management     Time spent: 50 minutes  Unresulted Labs (From admission, onward)     Start     Ordered   04/27/24 0839  Culture, blood (Routine X 2) w Reflex to ID Panel  BLOOD CULTURE X 2,   R      04/27/24 0838   04/26/24 0500  Basic metabolic panel with GFR  Daily,   R      04/25/24 0941   04/26/24 0500  Magnesium  Daily,   R      04/25/24 0941   04/26/24 0500  CBC  Daily,   R      04/25/24 9058             Author: Delon Herald, MD 04/27/2024 4:24 PM  For on call review [email or web address].

## 2024-04-27 NOTE — Progress Notes (Signed)
 04/27/2024  Gavin Hernandez [id number] 10-27-1945  CARE TEAM: PCP: Chrystal Lamarr RAMAN, MD  Outpatient Care Team: Patient Care Team: Chrystal Lamarr RAMAN, MD as PCP - General (Family Medicine) Thukkani, Arun K, MD as PCP - Cardiology (Cardiology) Sheldon Standing, MD as Consulting Physician (General Surgery) Saintclair Jasper, MD as Consulting Physician (Gastroenterology)  Inpatient Treatment Team: Treatment Team:  Barbarann Nest, MD Bobbette File, MD Britta Eva HERO, [facility] Derk Pump, RN Delores Ronal MATSU, RN Doneta Glenys DASEN, RN Sheldon Standing, MD   Problem List:   Principal Problem:   UTI (urinary tract infection) Active Problems:   AKI (acute kidney injury) [facility])   Generalized weakness   * No surgery found *      Assessment [facility] Stay = 3 days)      Recovering from hemorrhoid surgery.  Persistent urinary retention with pyelonephritis    Plan:  I am glad that he feels good with the hemorrhoid surgery.  He has some mild left lateral inflammatory external tags that hopefully will shrink down over time.  He can consider switching to powder to help dry the perianal region.  Continue warm soaks.  Gave usual hemorrhoid fiber bowel regimen  The anatomy & physiology of the anorectal region was discussed.  The pathophysiology of hemorrhoids and differential diagnosis was discussed.  Natural history progression  was discussed.   I stressed the importance of a bowel regimen to have daily soft bowel movements to minimize progression of disease.   Goal of one BM / day ideal.  Use of wet wipes, warm baths, avoiding straining, etc were emphasized.  He is due to follow-up with [facility] next Tuesday for an attempt another voiding trial versus adjustment or other interventions.  Foley catheter care and training has been educated with him and he feels more comfortable with that.    As long as he continues to improve, I do not know if I need to physically see him in the  office.  I did encourage them to call us  in a couple of weeks to let us  know how he is doing and make sure that the hemorrhoids are [place] not bother him.  Low threshold to see in clinic if he wishes.  However he is getting outside the window of complications from the hemorrhoids standpoint aside from his prostate issues/chronic Foley/urinary retention.    The patient expressed understanding.     I reviewed nursing notes, ED provider notes, hospitalist notes, last 24 h vitals and pain scores, last 48 h intake and output, last 24 h labs and trends, and last 24 h imaging results.  I have reviewed this patient's available data, including medical history, events of note, test results, etc as part of my evaluation.   A significant portion of that time was spent in counseling. Care during the described time interval was provided by me.  This care required straight-forward level of medical decision making.  04/27/2024    Subjective: (Chief complaint)  Notified of patient's admission.  Patient has had issues with difficulty urinating after hemorrhoidectomy and followed by Dr. Devere in the [facility] team.  Had failed voiding trial so they are increasing his Flomax.  Unfortunately patient had fevers and chills suspicious for urinary tract infection/pyelonephritis.  Rapidly improving with antibiotics.  Patient notes the hemorrhoids do not bother him and they are much better than they were preop already.  He did have some loose bowel movement diarrhea on the antibiotics but is stools are firming up and  he is feeling good.  Wife at bedside.  Objective:  Vital signs:  Vitals:   04/26/24 1419 04/26/24 1953 04/27/24 0449 04/27/24 1128  BP: 120/72 (!) 144/74 (!) 145/87 124/71  Pulse: 64 70 66 (!) 59  Resp: 16 18 18    Temp: 98 F (36.7 C) 98.8 F (37.1 C) (!) 97.2 F (36.2 C) 98.1 F (36.7 C)  TempSrc: Oral  Oral   SpO2: 94% 97% 95% 96%  Weight:      Height:        Last BM  Date : 04/27/24  Intake/Output   Yesterday:  07/02 0701 - 07/03 0700 In: 100 [IV Piggyback:100] Out: 3425 [Urine:3425] This shift:  Total I/O In: 240 [P.O.:240] Out: 800 [Urine:800]  Bowel function:  Flatus: YES  BM:  YES  Drain: (No drain)   Physical Exam:  General: Pt awake/alert in no acute distress Eyes: PERRL, normal EOM.  Sclera clear.  No icterus Neuro: CN II-XII intact w/o focal sensory/motor deficits. Lymph: No head/neck/groin lymphadenopathy Psych:  No delerium/psychosis/paranoia.  Oriented x 4 HENT: Normocephalic, Mucus membranes moist.  No thrush Neck: Supple, No tracheal deviation.  No obvious thyromegaly Chest: No pain to chest wall compression.  Good respiratory excursion.  No audible wheezing CV:  Pulses intact.  Regular rhythm.  No major extremity edema MS: Normal AROM mjr joints.  No obvious deformity Abdomen: Soft.  Nondistended.  Mildly tender at incisions only.  No evidence of peritonitis.  No incarcerated hernias.  GU: Foley catheter in place with clear light yellow urine in place no hematuria or purulence  Perianal skin with minimal pruritus.  Left lateral external hemorrhoidal tissue mild and inflammatory.  Sphincter tone mildly increased.  No fissure or abscess.  No other major concerning hemorrhoids.  I held off on internal exam.  Markedly improved from preop already.  Ext:   No deformity.  No mjr edema.  No cyanosis Skin: No petechiae / purpurea.  No major sores.  Warm and dry    Results:   Cultures: Recent Results (from the past 720 hours)  Blood Culture (routine x 2)     Status: Abnormal   Collection Time: 04/24/24  3:10 PM   Specimen: BLOOD  Result Value Ref Range Status   Specimen Description   Final    BLOOD RIGHT ANTECUBITAL Performed at [facility], [address] [place], [place] [address]    Special Requests   Final    BOTTLES DRAWN AEROBIC AND ANAEROBIC Blood Culture adequate volume Performed at [facility], [address] [place], [place] [address]    Culture  Setup Time   Final    GRAM NEGATIVE RODS AEROBIC BOTTLE ONLY CRITICAL VALUE NOTED.  VALUE IS CONSISTENT WITH PREVIOUSLY REPORTED AND CALLED VALUE.    Culture (A)  Final    KLEBSIELLA OXYTOCA SUSCEPTIBILITIES PERFORMED ON PREVIOUS CULTURE WITHIN THE LAST 5 DAYS. Performed at [facility], [address], [place], [place] [address]    Report Status 04/27/2024 FINAL  Final  Blood Culture (routine x 2)     Status: Abnormal   Collection Time: 04/24/24  3:10 PM   Specimen: BLOOD  Result Value Ref Range Status   Specimen Description   Final    BLOOD LEFT ANTECUBITAL Performed at [facility], [address], [place], [place] [address]    Special Requests   Final    BOTTLES DRAWN AEROBIC AND ANAEROBIC Blood Culture adequate volume Performed at [facility], [address] [place], [place] [address]    Culture  Setup Time   Final    GRAM NEGATIVE RODS ANAEROBIC BOTTLE ONLY CRITICAL RESULT CALLED TO, READ BACK BY AND VERIFIED WITH: PHARMD JUSTIN L [id number]  FCP Performed at [facility], [address], [place], [place] [address]    Culture KLEBSIELLA OXYTOCA (A)  Final   Report Status 04/27/2024 FINAL  Final   Organism ID, Bacteria KLEBSIELLA OXYTOCA  Final      Susceptibility   Klebsiella oxytoca - MIC*    AMPICILLIN RESISTANT Resistant     CEFEPIME <=0.12 SENSITIVE Sensitive     CEFTAZIDIME <=1 SENSITIVE Sensitive     CEFTRIAXONE <=0.25 SENSITIVE Sensitive     CIPROFLOXACIN <=0.25 SENSITIVE Sensitive     GENTAMICIN <=1 SENSITIVE Sensitive     IMIPENEM 0.5 SENSITIVE Sensitive     TRIMETH/SULFA <=20 SENSITIVE Sensitive     AMPICILLIN/SULBACTAM 8 SENSITIVE Sensitive     PIP/TAZO <=4 SENSITIVE Sensitive ug/mL    * KLEBSIELLA OXYTOCA  Blood Culture ID Panel (Reflexed)     Status: Abnormal   Collection Time: 04/24/24  3:10 PM  Result Value Ref  Range Status   Enterococcus faecalis NOT DETECTED NOT DETECTED Final   Enterococcus Faecium NOT DETECTED NOT DETECTED Final   Listeria monocytogenes NOT DETECTED NOT DETECTED Final   Staphylococcus species NOT DETECTED NOT DETECTED Final   Staphylococcus aureus (BCID) NOT DETECTED NOT DETECTED Final   Staphylococcus epidermidis NOT DETECTED NOT DETECTED Final   Staphylococcus lugdunensis NOT DETECTED NOT DETECTED Final   Streptococcus species NOT DETECTED NOT DETECTED Final   Streptococcus agalactiae NOT DETECTED NOT DETECTED Final   Streptococcus pneumoniae NOT DETECTED NOT DETECTED Final   Streptococcus pyogenes NOT DETECTED NOT DETECTED Final   A.calcoaceticus-baumannii NOT DETECTED NOT DETECTED Final   Bacteroides fragilis NOT DETECTED NOT DETECTED Final   Enterobacterales DETECTED (A) NOT DETECTED Final    Comment: Enterobacterales represent a large order of gram negative bacteria, not a single organism. CRITICAL RESULT CALLED TO, READ BACK BY AND VERIFIED WITH: PHARMD JUSTIN L [id number]  FCP    Enterobacter cloacae complex NOT DETECTED NOT DETECTED Final   Escherichia coli NOT DETECTED NOT DETECTED Final   Klebsiella aerogenes NOT DETECTED NOT DETECTED Final   Klebsiella oxytoca DETECTED (A) NOT DETECTED Final    Comment: CRITICAL RESULT CALLED TO, READ BACK BY AND VERIFIED WITH: PHARMD JUSTIN L [id number]  FCP    Klebsiella pneumoniae NOT DETECTED NOT DETECTED Final   Proteus species NOT DETECTED NOT DETECTED Final   Salmonella species NOT DETECTED NOT DETECTED Final   Serratia marcescens NOT DETECTED NOT DETECTED Final   Haemophilus influenzae NOT DETECTED NOT DETECTED Final   Neisseria meningitidis NOT DETECTED NOT DETECTED Final   Pseudomonas aeruginosa NOT DETECTED NOT DETECTED Final   Stenotrophomonas maltophilia NOT DETECTED NOT DETECTED Final   Candida albicans NOT DETECTED NOT DETECTED Final   Candida auris NOT DETECTED NOT DETECTED Final   Candida glabrata NOT  DETECTED NOT DETECTED Final   Candida krusei NOT DETECTED NOT DETECTED Final   Candida parapsilosis NOT DETECTED NOT DETECTED Final   Candida tropicalis NOT DETECTED NOT DETECTED Final   Cryptococcus neoformans/gattii NOT DETECTED NOT DETECTED Final   CTX-M ESBL NOT DETECTED NOT DETECTED Final   Carbapenem resistance IMP NOT DETECTED NOT DETECTED Final   Carbapenem resistance KPC NOT DETECTED NOT DETECTED Final   Carbapenem resistance NDM NOT DETECTED NOT DETECTED Final  Carbapenem resist OXA 48 LIKE NOT DETECTED NOT DETECTED Final   Carbapenem resistance VIM NOT DETECTED NOT DETECTED Final    Comment: Performed at [facility], [address] [place], [place] [address]  Resp panel by RT-PCR (RSV, Flu A&B, Covid) Anterior Nasal Swab     Status: None   Collection Time: 04/24/24  3:32 PM   Specimen: Anterior Nasal Swab  Result Value Ref Range Status   SARS Coronavirus 2 by RT PCR NEGATIVE NEGATIVE Final    Comment: (NOTE) SARS-CoV-2 target nucleic acids are NOT DETECTED.  The SARS-CoV-2 RNA is generally detectable in upper respiratory specimens during the acute phase of infection. The lowest concentration of SARS-CoV-2 viral copies this assay can detect is 138 copies/mL. A negative result does not preclude SARS-Cov-2 infection and should not be used as the sole basis for treatment or other patient management decisions. A negative result may occur with  improper specimen collection/handling, submission of specimen other than nasopharyngeal swab, presence of viral mutation(s) within the areas targeted by this assay, and inadequate number of viral copies(<138 copies/mL). A negative result must be combined with clinical observations, patient history, and epidemiological information. The expected result is Negative.  Fact Sheet for Patients:  BloggerCourse.com  Fact Sheet for Healthcare Providers:  SeriousBroker.it  This test is  no t yet approved or cleared by the [place]  FDA and  has been authorized for detection and/or diagnosis of SARS-CoV-2 by FDA under an Emergency Use Authorization (EUA). This EUA will remain  in effect (meaning this test can be used) for the duration of the COVID-19 declaration under Section 564(b)(1) of the Act, 21 U.S.C.section 360bbb-3(b)(1), unless the authorization is terminated  or revoked sooner.       Influenza A by PCR NEGATIVE NEGATIVE Final   Influenza B by PCR NEGATIVE NEGATIVE Final    Comment: (NOTE) The Xpert Xpress SARS-CoV-2/FLU/RSV plus assay is intended as an aid in the diagnosis of influenza from Nasopharyngeal swab specimens and should not be used as a sole basis for treatment. Nasal washings and aspirates are unacceptable for Xpert Xpress SARS-CoV-2/FLU/RSV testing.  Fact Sheet for Patients: BloggerCourse.com  Fact Sheet for Healthcare Providers: SeriousBroker.it  This test is not yet approved or cleared by the [place]  FDA and has been authorized for detection and/or diagnosis of SARS-CoV-2 by FDA under an Emergency Use Authorization (EUA). This EUA will remain in effect (meaning this test can be used) for the duration of the COVID-19 declaration under Section 564(b)(1) of the Act, 21 U.S.C. section 360bbb-3(b)(1), unless the authorization is terminated or revoked.     Resp Syncytial Virus by PCR NEGATIVE NEGATIVE Final    Comment: (NOTE) Fact Sheet for Patients: BloggerCourse.com  Fact Sheet for Healthcare Providers: SeriousBroker.it  This test is not yet approved or cleared by the [place]  FDA and has been authorized for detection and/or diagnosis of SARS-CoV-2 by FDA under an Emergency Use Authorization (EUA). This EUA will remain in effect (meaning this test can be used) for the duration of the COVID-19 declaration under Section  564(b)(1) of the Act, 21 U.S.C. section 360bbb-3(b)(1), unless the authorization is terminated or revoked.  Performed at [facility], [address], [place], [place] [address]   Urine Culture (for pregnant, neutropenic or urologic patients or patients with an indwelling urinary catheter)     Status: Abnormal   Collection Time: 04/24/24  9:32 PM   Specimen: Urine, Catheterized  Result Value Ref Range Status  Specimen Description   Final    URINE, CATHETERIZED Performed at [facility], [address], [place], [place] [address]    Special Requests   Final    NONE Performed at [facility], [address] [place], [place] [address]    Culture 80,000 COLONIES/mL ENTEROCOCCUS FAECALIS (A)  Final   Report Status 04/27/2024 FINAL  Final   Organism ID, Bacteria ENTEROCOCCUS FAECALIS (A)  Final      Susceptibility   Enterococcus faecalis - MIC*    AMPICILLIN <=2 SENSITIVE Sensitive     NITROFURANTOIN <=16 SENSITIVE Sensitive     VANCOMYCIN 1 SENSITIVE Sensitive     * 80,000 COLONIES/mL ENTEROCOCCUS FAECALIS    Labs: Results for orders placed or performed during the hospital encounter of 04/24/24 (from the past 48 hours)  Basic metabolic panel with GFR     Status: Abnormal   Collection Time: 04/26/24  4:35 AM  Result Value Ref Range   Sodium 137 135 - 145 mmol/L   Potassium 4.0 3.5 - 5.1 mmol/L   Chloride 106 98 - 111 mmol/L   CO2 23 22 - 32 mmol/L   Glucose, Bld 97 70 - 99 mg/dL    Comment: Glucose reference range applies only to samples taken after fasting for at least 8 hours.   BUN 40 (H) 8 - 23 mg/dL   Creatinine, Ser 7.73 (H) 0.61 - 1.24 mg/dL   Calcium  8.2 (L) 8.9 - 10.3 mg/dL   GFR, Estimated 29 (L) >60 mL/min    Comment: (NOTE) Calculated using the CKD-EPI Creatinine Equation (2021)    Anion gap 8 5 - 15    Comment: Performed at [facility], [address], [place], [place] [address]   Magnesium     Status: None   Collection Time: 04/26/24  4:35 AM  Result Value Ref Range   Magnesium 1.7 1.7 - 2.4 mg/dL    Comment: Performed at [facility], [address] [place], [place] [address]  CBC     Status: Abnormal   Collection Time: 04/26/24  4:35 AM  Result Value Ref Range   WBC 13.1 (H) 4.0 - 10.5 K/uL   RBC 3.44 (L) 4.22 - 5.81 MIL/uL   Hemoglobin 11.3 (L) 13.0 - 17.0 g/dL   HCT 66.5 (L) 60.9 - 47.9 %   MCV 97.1 80.0 - 100.0 fL   MCH 32.8 26.0 - 34.0 pg   MCHC 33.8 30.0 - 36.0 g/dL   RDW 87.2 88.4 - 84.4 %   Platelets 164 150 - 400 K/uL   nRBC 0.0 0.0 - 0.2 %    Comment: Performed at [facility], [address], [place], [place] [address]  Phosphorus     Status: None   Collection Time: 04/26/24  4:35 AM  Result Value Ref Range   Phosphorus 3.6 2.5 - 4.6 mg/dL    Comment: Performed at [facility], [address] [place], [place] [address]  Basic metabolic panel with GFR     Status: Abnormal   Collection Time: 04/27/24  4:15 AM  Result Value Ref Range   Sodium 139 135 - 145 mmol/L   Potassium 4.1 3.5 - 5.1 mmol/L   Chloride 107 98 - 111 mmol/L   CO2 23 22 - 32 mmol/L   Glucose, Bld 114 (H) 70 - 99 mg/dL    Comment: Glucose reference range applies only to samples taken after fasting for at least  8 hours.   BUN 39 (H) 8 - 23 mg/dL   Creatinine, Ser 7.86 (H) 0.61 - 1.24 mg/dL   Calcium  8.3 (L) 8.9 - 10.3 mg/dL   GFR, Estimated 31 (L) >60 mL/min    Comment: (NOTE) Calculated using the CKD-EPI Creatinine Equation (2021)    Anion gap 9 5 - 15    Comment: Performed at [facility], [address], [place], [place] [address]  Magnesium     Status: Abnormal   Collection Time: 04/27/24  4:15 AM  Result Value Ref Range   Magnesium 1.6 (L) 1.7 - 2.4 mg/dL    Comment: Performed at [facility], [address], [place], [place] [address]  CBC     Status: Abnormal    Collection Time: 04/27/24  4:15 AM  Result Value Ref Range   WBC 11.1 (H) 4.0 - 10.5 K/uL   RBC 3.52 (L) 4.22 - 5.81 MIL/uL   Hemoglobin 11.2 (L) 13.0 - 17.0 g/dL   HCT 66.3 (L) 60.9 - 47.9 %   MCV 95.5 80.0 - 100.0 fL   MCH 31.8 26.0 - 34.0 pg   MCHC 33.3 30.0 - 36.0 g/dL   RDW 87.3 88.4 - 84.4 %   Platelets 180 150 - 400 K/uL   nRBC 0.0 0.0 - 0.2 %    Comment: Performed at [facility], [address] [place], [place] [address]    Imaging / Studies: US  RENAL Result Date: 04/25/2024 CLINICAL DATA:  Acute kidney injury.  Previous nephrectomy EXAM: RENAL / URINARY TRACT ULTRASOUND COMPLETE COMPARISON:  CT 01/25/2024 abdomen FINDINGS: Right Kidney: Surgically absent as per history Left Kidney: Renal measurements: 14.2 x 8.0 x 7.1 cm = volume: 422.6 mL. 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 This appears simple. Through transmission. Thin wall. Smaller parenchymal cyst identified measuring 13 mm. Unchanged from prior. Slight ectasia of the calices. No perinephric fluid. Bladder: Contracted Other: None. 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 Please correlate for etiology. Further workup when clinically appropriate Electronically Signed   By: Ranell Bring M.D.   On: 04/25/2024 15:20    Medications / Allergies: per chart  Antibiotics: Anti-infectives (From admission, onward)    Start     Dose/Rate Route Frequency Ordered Stop   04/26/24 1000  cefTRIAXone (ROCEPHIN) 2 g in sodium chloride  0.9 % 100 mL IVPB        2 g 200 mL/hr over 30 Minutes Intravenous Every 24 hours 04/25/24 0905     04/25/24 1030  cefTRIAXone (ROCEPHIN) 1 g in sodium chloride  0.9 % 100 mL IVPB        1 g 200 mL/hr over 30 Minutes Intravenous  Once 04/25/24 0905 04/25/24 1058   04/25/24 0400  cefTRIAXone (ROCEPHIN) 1 g in sodium chloride  0.9 % 100 mL IVPB  Status:  Discontinued        1 g 200 mL/hr over 30  Minutes Intravenous Every 24 hours 04/24/24 2225 04/25/24 0905   04/24/24 1445  ceFEPIme (MAXIPIME) 2 g in sodium chloride  0.9 % 100 mL IVPB        2 g 200 mL/hr over 30 Minutes Intravenous  Once 04/24/24 1438 04/24/24 1623         Note: Portions of this report may have been transcribed using voice recognition software. Every effort was made to ensure accuracy; however, inadvertent computerized transcription errors may be present.   Any  transcriptional errors that result from this process are unintentional.    Elspeth KYM Schultze, MD, FACS, MASCRS Esophageal, Gastrointestinal & Colorectal Surgery Robotic and Minimally Invasive Surgery  [facility] [address], [address] [place], [place] [address] [phone] Fax [phone] Main  CONTACT INFORMATION: Weekday (9AM-5PM): Call [facility] main office at [phone] Weeknight (5PM-9AM) or Weekend/Holiday: Check EPIC Web Links tab & use AMION (password  [email or web address]) for General Surgery [facility] coverage  Please, DO NOT use SecureChat  (it is not reliable communication to reach operating surgeons & will lead to a delay in care).   Epic staff messaging available for outptient concerns needing 1-2 business day response.      04/27/2024  1:18 PM

## 2024-04-27 NOTE — Plan of Care (Signed)

## 2024-04-27 NOTE — Plan of Care (Signed)
  Problem: Clinical Measurements: Goal: Ability to maintain clinical measurements within normal limits will improve Outcome: Progressing   Problem: Education: Goal: Knowledge of General Education information will improve Description: Including pain rating scale, medication(s)/side effects and non-pharmacologic comfort measures Outcome: Progressing   

## 2024-04-27 NOTE — Plan of Care (Signed)
   Problem: Education: Goal: Knowledge of General Education information will improve Description Including pain rating scale, medication(s)/side effects and non-pharmacologic comfort measures Outcome: Progressing   Problem: Health Behavior/Discharge Planning: Goal: Ability to manage health-related needs will improve Outcome: Progressing

## 2024-04-27 NOTE — Progress Notes (Signed)
 VASCULAR LAB    Bilateral lower extremity venous duplex has been performed.  See CV proc for preliminary results.   Alonzo Owczarzak, RVT 04/27/2024, 2:47 PM

## 2024-04-28 DIAGNOSIS — Z8719 Personal history of other diseases of the digestive system: Secondary | ICD-10-CM

## 2024-04-28 DIAGNOSIS — R197 Diarrhea, unspecified: Secondary | ICD-10-CM | POA: Diagnosis present

## 2024-04-28 DIAGNOSIS — R609 Edema, unspecified: Secondary | ICD-10-CM | POA: Diagnosis not present

## 2024-04-28 DIAGNOSIS — N1831 Chronic kidney disease, stage 3a: Secondary | ICD-10-CM | POA: Diagnosis present

## 2024-04-28 DIAGNOSIS — N400 Enlarged prostate without lower urinary tract symptoms: Secondary | ICD-10-CM | POA: Diagnosis present

## 2024-04-28 DIAGNOSIS — R7881 Bacteremia: Secondary | ICD-10-CM | POA: Diagnosis present

## 2024-04-28 DIAGNOSIS — B961 Klebsiella pneumoniae [K. pneumoniae] as the cause of diseases classified elsewhere: Secondary | ICD-10-CM

## 2024-04-28 DIAGNOSIS — N390 Urinary tract infection, site not specified: Secondary | ICD-10-CM

## 2024-04-28 DIAGNOSIS — B952 Enterococcus as the cause of diseases classified elsewhere: Secondary | ICD-10-CM

## 2024-04-28 DIAGNOSIS — I480 Paroxysmal atrial fibrillation: Secondary | ICD-10-CM | POA: Diagnosis present

## 2024-04-28 LAB — CBC
HCT: 35.6 % — ABNORMAL LOW (ref 39.0–52.0)
Hemoglobin: 11.7 g/dL — ABNORMAL LOW (ref 13.0–17.0)
MCH: 31.8 pg (ref 26.0–34.0)
MCHC: 32.9 g/dL (ref 30.0–36.0)
MCV: 96.7 fL (ref 80.0–100.0)
Platelets: 197 K/uL (ref 150–400)
RBC: 3.68 MIL/uL — ABNORMAL LOW (ref 4.22–5.81)
RDW: 12.4 % (ref 11.5–15.5)
WBC: 8.8 K/uL (ref 4.0–10.5)
nRBC: 0 % (ref 0.0–0.2)

## 2024-04-28 LAB — BASIC METABOLIC PANEL WITH GFR
Anion gap: 14 (ref 5–15)
BUN: 29 mg/dL — ABNORMAL HIGH (ref 8–23)
CO2: 22 mmol/L (ref 22–32)
Calcium: 8.7 mg/dL — ABNORMAL LOW (ref 8.9–10.3)
Chloride: 106 mmol/L (ref 98–111)
Creatinine, Ser: 1.72 mg/dL — ABNORMAL HIGH (ref 0.61–1.24)
GFR, Estimated: 40 mL/min — ABNORMAL LOW (ref >60)
Glucose, Bld: 95 mg/dL (ref 70–99)
Potassium: 4.2 mmol/L (ref 3.5–5.1)
Sodium: 142 mmol/L (ref 135–145)

## 2024-04-28 LAB — MAGNESIUM: Magnesium: 1.5 mg/dL — ABNORMAL LOW (ref 1.7–2.4)

## 2024-04-28 MED ORDER — SODIUM CHLORIDE 0.9 % IV SOLN
3.0000 g | Freq: Four times a day (QID) | INTRAVENOUS | Status: DC
  Administered 2024-04-28 – 2024-04-29 (×5): 3 g via INTRAVENOUS
  Filled 2024-04-28 (×7): qty 8

## 2024-04-28 MED ORDER — PSYLLIUM 95 % PO PACK
1.0000 | PACK | Freq: Every day | ORAL | Status: DC
  Administered 2024-04-28 – 2024-04-29 (×2): 1 via ORAL
  Filled 2024-04-28 (×2): qty 1

## 2024-04-28 NOTE — Progress Notes (Addendum)
 Progress Note   Patient: Gavin Hernandez FMW:[id number] DOB: 1945/12/11 DOA: 04/24/2024     4 DOS: the patient was seen and examined on 04/28/2024   Brief hospital course: 78yo with h/o AAA, afib not on [facility], RCC s/p R nephrectomy, stage 3a CKD, and urinary retention s/p indwelling foley who presented on 6/30 with fever.  He was found to have Klebsiella bacteremia, treated with Ceftriaxone .  Also with AKI on CKD, receiving IVF.  Foley was exchanged.  He had recent hemorrhoidectomy and does have loose stools at baseline; this significantly worsened on 7/4 AM.  ID was consulted.  Assessment and Plan:  Catheter associated urinary tract infection, POA Klebsiella bacteremia Patient has chronically indwelling catheter for BPH Now presenting with urinary tract infection S/p Foley catheter exchange Continue Ceftriaxone  Urine culture with E faecalis, blood cultures with Klebsiella oxytoca Urine culture may be a contaminant given indwelling foley; will defer to ID re: need for treatment with fosfomycin Blood cultures are being treated with Ceftriaxone  and can likely transition to PO when appropriate - pharmacy recommends Cipro but will defer to ID Has f/u appointment scheduled with [facility] on Tuesday, July 8  Diarrhea Recent hemorrhoidectomy Current diarrhea could be related to this vs. Antibiotic effect vs. Infectious etiology Will add Metamucil for now ID consulted, Dr. 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 in November 2022 Baseline creatinine 1.6, admission creatinine 2.08 > 2.25 Continues to improve, near baseline now Renal US  with slight ectasia of the left-sided collecting symptom Will eventually need f/u   Edema Developed anasarca on 7/2, thought to be related to excessive IVF and so these were discontinued Noted to have significant RLE edema compared to L  on 7/3 DVT US  negative He is having R ankle pain with edema but no warmth or erythema to raise concern for septic arthritis or gout currently Improving with ACE wrap Ambulated 350 ft today with mobility specialist   History of BPH Has chronic indwelling Foley catheter Continue Flomax   Follow-up outpatient with urology next week as scheduled   Paroxysmal A-fib Toprol -XL on hold due to soft blood pressure Not on chronic anticoagulation   Essential hypertension PO meds on hold IV prn   Hyperlipidemia Continue atorvastatin    GERD Continue Pepcid  and Protonix    Generalized weakness PT/OT consulted No f/u recommended       Consultants: PT OT   Procedures: None   Antibiotics: Cefepime  x 1 Ceftriaxone  7/1-     30 Day Unplanned Readmission Risk Score    Flowsheet Row ED to Hosp-Admission (Current) from 04/24/2024 in [facility]  30 Day Unplanned Readmission Risk Score (%) 16.84 Filed at 04/28/2024 0801    This score is the patient's risk of an unplanned readmission within 30 days of being discharged (0 -100%). The score is based on dignosis, age, lab data, medications, orders, and past utilization.   Low:  0-14.9   Medium: 15-21.9   High: 22-29.9   Extreme: 30 and above           Subjective: Diarrhea x 4 stools today. Ankle edema is better, still limping.  No other new concerns.   Objective: Vitals:   04/28/24 0415 04/28/24 1327  BP: (!) 153/84 127/74  Pulse: (!) 52 (!) 52  Resp: 20 16  Temp: 98.3 F (36.8 C) 98.1 F (36.7 C)  SpO2:  98% 96%    Intake/Output Summary (Last 24 hours) at 04/28/2024 1508 Last data filed at 04/28/2024 1008 Gross per 24 hour  Intake 600 ml  Output 2100 ml  Net -1500 ml   Filed Weights   04/25/24 1329  Weight: 86.2 kg    Exam:  General:  Appears calm and comfortable and is in NAD, sitting up on bedside Eyes:  EOMI, normal lids, iris ENT:  grossly normal hearing, lips &  tongue, mmm Cardiovascular:  RRR, no m/r/g. Edema is improved. Respiratory:   CTA bilaterally with no wheezes/rales/rhonchi.  Normal respiratory effort. Abdomen:  soft, NT, ND Skin:  no rash or induration seen on limited exam Musculoskeletal:  RLE edema is markedly improved.  Still with ankle edema, no erythema or warmth. Psychiatric:  grossly normal mood and affect, speech fluent and appropriate, AOx3 Neurologic:  CN 2-12 grossly intact, moves all extremities in coordinated fashion  Data Reviewed: I have reviewed the patient's lab results since admission.  Pertinent labs for today include:   BUN 29/Creatinine 1.72/GFR 40, continuing to improve WBC 8.8 Hgb 11.7, stable Blood cultures + Klebsiella Oxytoca, resistant to Ampicillin   Urine culture + Enterococcus faecalis, ?contaminant   Family Communication: None present  Disposition: Status is: Inpatient Remains inpatient appropriate because: ongoing management     Time spent: 50 minutes  Unresulted Labs (From admission, onward)     Start     Ordered   04/26/24 0500  Basic metabolic panel with GFR  Daily,   R      04/25/24 0941   04/26/24 0500  Magnesium  Daily,   R      04/25/24 0941   04/26/24 0500  CBC  Daily,   R      04/25/24 9058             Author: Delon Herald, MD 04/28/2024 3:08 PM  For on call review [email or web address].

## 2024-04-28 NOTE — Progress Notes (Signed)
 Mobility Specialist - Progress Note   04/28/24 1432  Mobility  Activity Ambulated independently in hallway  Level of Assistance Independent  Assistive Device None  Distance Ambulated (ft) 350 ft  Activity Response Tolerated well  Mobility Referral Yes  Mobility visit 1 Mobility  Mobility Specialist Start Time (ACUTE ONLY) 1423  Mobility Specialist Stop Time (ACUTE ONLY) 1430  Mobility Specialist Time Calculation (min) (ACUTE ONLY) 7 min   Pt received in bathroom and agreeable to mobility. No complaints during session. Pt to EOB after session with all needs met.    [facility]

## 2024-04-28 NOTE — Plan of Care (Signed)

## 2024-04-28 NOTE — Consult Note (Addendum)
 [facility]  Total days of antibiotics 5/ceftriaxone          Reason for Consult:kelbsiella bacteremia and diarrhea    Referring Physician: yates  Principal Problem:   Bacteremia due to Gram-negative bacteria Active Problems:   Hypercholesterolemia   UTI (urinary tract infection)   AKI (acute kidney injury) (HCC)   Diarrhea   S/P hemorrhoidectomy   Chronic kidney disease, stage 3a (HCC)   Edema   BPH (benign prostatic hyperplasia)   PAF (paroxysmal atrial fibrillation) (HCC)    HPI: Gavin Hernandez is a 78 y.o. male with history of BPH, urinary retention-foley catheter, hx of hemorrhoidectomy in early June who presented with fever, and chills on 6/30 and referred by his urologist for admission concern for uti, pyelonephritis. On admit, he had fever of 101.34F, leukocytosis of 21K,aki (cr of 2 up from baseline of 1.4). infectious work up revealed - kleb oxytoca bacteremia, urine cx had 80L e.faecalis. renal u/s did not show any stranding. He had repeat blood cx showing clearance of bacteremia on 7/3 after 3 days of ceftriaxone . This morning, he started to have frequent soft, watery stools but no abdominal cramping, no blood, no fever, his WBC is now down to 8.8. cr impoving now down to 1.72. he is feeling better, had soreness to his lower extremities from pedal edema. He was ruled out for DVT.  Past Medical History:  Diagnosis Date   AAA (abdominal aortic aneurysm) without rupture (HCC)    Abnormal echocardiogram    Aortic valve sclerosis    Arthritis    Atrial fibrillation (HCC)    Cancer (HCC)    skin & kidney   Cardiac murmur    Dyslipidemia    GERD (gastroesophageal reflux disease)    History of atrial fibrillation    History of kidney stones    Hypertension    Inguinal hernia    Irregular heart beat    Lipidemia    LVH (left ventricular hypertrophy)    MVP (mitral valve prolapse)    Non-rheumatic mitral regurgitation    Non-rheumatic mitral  regurgitation    OSA (obstructive sleep apnea)    Palpitations    PVD (peripheral vascular disease) (HCC)    Renal disorder    Right renal mass    Secondary polycythemia    Skin cancer    Stage 2 chronic kidney disease    Tremor     Allergies:  Allergies  Allergen Reactions   Hylan G-F 20 Other (See Comments)    UNKNOWN??   Lactose Diarrhea   Sulfa Antibiotics Hives, Itching and Dermatitis      MEDICATIONS:  atorvastatin   20 mg Oral QPM   Chlorhexidine  Gluconate Cloth  6 each Topical Daily   cycloSPORINE   1 drop Both Eyes BID   enoxaparin  (LOVENOX ) injection  40 mg Subcutaneous Q24H   famotidine   10 mg Oral Daily   feeding supplement  237 mL Oral TID BM   multivitamin with minerals  1 tablet Oral Daily   nystatin    Topical BID   pantoprazole   40 mg Oral QAC supper   tamsulosin   0.8 mg Oral QPC supper    Social History   Tobacco Use   Smoking status: Never   Smokeless tobacco: Never  Vaping Use   Vaping status: Never Used  Substance Use Topics   Alcohol use: Yes    Comment: occ   Drug use: Never    Family History  Problem Relation Age  of Onset   Lung disease Mother    Other Father        MVA   Diabetes Sister    Heart Problems Brother        PACEMAKER   Colon cancer Neg Hx    Stomach cancer Neg Hx    Esophageal cancer Neg Hx    Colon polyps Neg Hx     Review of Systems -  12 point ros is negative except what is mentioned above.  OBJECTIVE: Temp:  [98.1 F (36.7 C)-98.7 F (37.1 C)] 98.1 F (36.7 C) (07/04 1327) Pulse Rate:  [52-61] 52 (07/04 1327) Resp:  [16-20] 16 (07/04 1327) BP: (127-153)/(74-84) 127/74 (07/04 1327) SpO2:  [93 %-98 %] 96 % (07/04 1327) Physical Exam  Constitutional: He is oriented to person, place, and time. He appears well-developed and well-nourished. No distress.  HENT:  Mouth/Throat: Oropharynx is clear and moist. No oropharyngeal exudate.  Cardiovascular: Normal rate, regular rhythm and normal heart sounds. Exam  reveals no gallop and no friction rub.  No murmur heard.  Pulmonary/Chest: Effort normal and breath sounds normal. No respiratory distress. He has no wheezes.  Abdominal: Soft. Bowel sounds are normal. He exhibits no distension. There is no tenderness.  Lymphadenopathy:  He has no cervical adenopathy.  Neurological: He is alert and oriented to person, place, and time.  Skin: Skin is warm and dry. No rash noted. No erythema.  Psychiatric: He has a normal mood and affect. His behavior is normal.     LABS: Results for orders placed or performed during the hospital encounter of 04/24/24 (from the past 48 hours)  Basic metabolic panel with GFR     Status: Abnormal   Collection Time: 04/27/24  4:15 AM  Result Value Ref Range   Sodium 139 135 - 145 mmol/L   Potassium 4.1 3.5 - 5.1 mmol/L   Chloride 107 98 - 111 mmol/L   CO2 23 22 - 32 mmol/L   Glucose, Bld 114 (H) 70 - 99 mg/dL    Comment: Glucose reference range applies only to samples taken after fasting for at least 8 hours.   BUN 39 (H) 8 - 23 mg/dL   Creatinine, Ser 7.86 (H) 0.61 - 1.24 mg/dL   Calcium  8.3 (L) 8.9 - 10.3 mg/dL   GFR, Estimated 31 (L) >60 mL/min    Comment: (NOTE) Calculated using the CKD-EPI Creatinine Equation (2021)    Anion gap 9 5 - 15    Comment: Performed at [facility], [address], [place], [place] [address]  Magnesium     Status: Abnormal   Collection Time: 04/27/24  4:15 AM  Result Value Ref Range   Magnesium 1.6 (L) 1.7 - 2.4 mg/dL    Comment: Performed at [facility], [address] [place], [place] [address]  CBC     Status: Abnormal   Collection Time: 04/27/24  4:15 AM  Result Value Ref Range   WBC 11.1 (H) 4.0 - 10.5 K/uL   RBC 3.52 (L) 4.22 - 5.81 MIL/uL   Hemoglobin 11.2 (L) 13.0 - 17.0 g/dL   HCT 66.3 (L) 60.9 - 47.9 %   MCV 95.5 80.0 - 100.0 fL   MCH 31.8 26.0 - 34.0 pg   MCHC 33.3 30.0 - 36.0 g/dL   RDW 87.3 88.4 - 84.4 %   Platelets 180 150  - 400 K/uL   nRBC 0.0 0.0 - 0.2 %    Comment: Performed at [facility], [address] [place], [place] [address]  Culture, blood (Routine X 2) w Reflex to ID Panel     Status: None (Preliminary result)   Collection Time: 04/27/24  9:43 AM   Specimen: BLOOD LEFT HAND  Result Value Ref Range   Specimen Description      BLOOD LEFT HAND Performed at [facility], [address], [place], [place] [address]    Special Requests      BOTTLES DRAWN AEROBIC AND ANAEROBIC Blood Culture results may not be optimal due to an inadequate volume of blood received in culture bottles Performed at [facility], [address] [place], [place] [address]    Culture      NO GROWTH < 24 HOURS Performed at [facility], [address] [place], [place] [address]    Report Status PENDING   Culture, blood (Routine X 2) w Reflex to ID Panel     Status: None (Preliminary result)   Collection Time: 04/27/24  9:55 AM   Specimen: BLOOD RIGHT HAND  Result Value Ref Range   Specimen Description      BLOOD RIGHT HAND Performed at [facility], [address], [place], [place] [address]    Special Requests      BOTTLES DRAWN AEROBIC AND ANAEROBIC Blood Culture results may not be optimal due to an inadequate volume of blood received in culture bottles Performed at [facility], [address] [place], [place] [address]    Culture      NO GROWTH < 24 HOURS Performed at [facility], [address], [place], [place] [address]    Report Status PENDING   Basic metabolic panel with GFR     Status: Abnormal   Collection Time: 04/28/24  4:21 AM  Result Value Ref Range   Sodium 142 135 - 145 mmol/L   Potassium 4.2 3.5 - 5.1 mmol/L   Chloride 106 98 - 111 mmol/L   CO2 22 22 - 32 mmol/L   Glucose, Bld 95 70 - 99 mg/dL    Comment: Glucose reference range applies only to samples taken after fasting for at least 8 hours.   BUN 29 (H) 8  - 23 mg/dL   Creatinine, Ser 8.27 (H) 0.61 - 1.24 mg/dL   Calcium  8.7 (L) 8.9 - 10.3 mg/dL   GFR, Estimated 40 (L) >60 mL/min    Comment: (NOTE) Calculated using the CKD-EPI Creatinine Equation (2021)    Anion gap 14 5 - 15    Comment: Performed at [facility], [address], [place], [place] [address]  Magnesium     Status: Abnormal   Collection Time: 04/28/24  4:21 AM  Result Value Ref Range   Magnesium 1.5 (L) 1.7 - 2.4 mg/dL    Comment: Performed at [facility], [address] [place], [place] [address]  CBC     Status: Abnormal   Collection Time: 04/28/24  4:21 AM  Result Value Ref Range   WBC 8.8 4.0 - 10.5 K/uL   RBC 3.68 (L) 4.22 - 5.81 MIL/uL   Hemoglobin 11.7 (L) 13.0 - 17.0 g/dL   HCT 64.3 (L) 60.9 - 47.9 %   MCV 96.7 80.0 - 100.0 fL   MCH 31.8 26.0 - 34.0 pg   MCHC 32.9 30.0 - 36.0 g/dL   RDW 87.5 88.4 - 84.4 %   Platelets 197 150 - 400 K/uL  nRBC 0.0 0.0 - 0.2 %    Comment: Performed at [facility], [address] [place], [place] [address]    MICRO: Klebsiella oxytoca      MIC    AMPICILLIN  RESISTANT Resistant    AMPICILLIN /SULBACTAM 8 SENSITIVE Sensitive    CEFEPIME  <=0.12 SENS... Sensitive    CEFTAZIDIME <=1 SENSITIVE Sensitive    CEFTRIAXONE  <=0.25 SENS... Sensitive    CIPROFLOXACIN <=0.25 SENS... Sensitive    GENTAMICIN <=1 SENSITIVE Sensitive    IMIPENEM 0.5 SENSITIVE Sensitive    PIP/TAZO <=4 SENSITI... Sensitive    TRIMETH/SULFA <=20 SENSIT... Sensitive    IMAGING: VAS US  LOWER EXTREMITY VENOUS (DVT) Result Date: 04/27/2024  Lower Venous DVT Study Patient Name:  GEOGE LAWRANCE  Date of Exam:   04/27/2024 Medical Rec #: [id number]      Accession #:    [id number] Date of Birth: 01/24/1946      Patient Gender: M Patient Age:   43 years Exam Location:  [facility] Procedure:      VAS US  LOWER EXTREMITY VENOUS (DVT) Referring Phys: DELON YATES  --------------------------------------------------------------------------------  Indications: Swelling, and Right ankle pain. Other Indications: Urinary retention s/p indwelling Foley, presenting with fever                    and chills, found to have catheter associated UTI Risk Factors: Renal cancer s/p right-sided nephrectomy. Limitations: Poor ultrasound/tissue interface and edema. Comparison Study: No prior study on file Performing Technologist: Alberta Lis RVS  Examination Guidelines: A complete evaluation includes B-mode imaging, spectral Doppler, color Doppler, and power Doppler as needed of all accessible portions of each vessel. Bilateral testing is considered an integral part of a complete examination. Limited examinations for reoccurring indications may be performed as noted. The reflux portion of the exam is performed with the patient in reverse Trendelenburg.  +---------+---------------+---------+-----------+----------+--------------+ RIGHT    CompressibilityPhasicitySpontaneityPropertiesThrombus Aging +---------+---------------+---------+-----------+----------+--------------+ CFV      Full           Yes      No                                  +---------+---------------+---------+-----------+----------+--------------+ SFJ      Full                                                        +---------+---------------+---------+-----------+----------+--------------+ FV Prox  Full                                                        +---------+---------------+---------+-----------+----------+--------------+ FV Mid   Full                                                        +---------+---------------+---------+-----------+----------+--------------+ FV DistalFull                                                        +---------+---------------+---------+-----------+----------+--------------+  PFV      Full                                                         +---------+---------------+---------+-----------+----------+--------------+ POP      Full           Yes      No                                  +---------+---------------+---------+-----------+----------+--------------+ Gastroc  Full                                                        +---------+---------------+---------+-----------+----------+--------------+ SSV      Full                                         thickened      +---------+---------------+---------+-----------+----------+--------------+   +---------+---------------+---------+-----------+----------+--------------+ LEFT     CompressibilityPhasicitySpontaneityPropertiesThrombus Aging +---------+---------------+---------+-----------+----------+--------------+ CFV      Full           Yes      No                                  +---------+---------------+---------+-----------+----------+--------------+ SFJ      Full                                                        +---------+---------------+---------+-----------+----------+--------------+ FV Prox  Full                                                        +---------+---------------+---------+-----------+----------+--------------+ FV Mid   Full                                                        +---------+---------------+---------+-----------+----------+--------------+ FV DistalFull           Yes      No                                  +---------+---------------+---------+-----------+----------+--------------+ PFV      Full                                                        +---------+---------------+---------+-----------+----------+--------------+  POP      Full                                                        +---------+---------------+---------+-----------+----------+--------------+ PTV      Full                                                         +---------+---------------+---------+-----------+----------+--------------+ PERO     Full                                                        +---------+---------------+---------+-----------+----------+--------------+ Gastroc  Full                                                        +---------+---------------+---------+-----------+----------+--------------+ SSV      Full                                         thickened      +---------+---------------+---------+-----------+----------+--------------+     Summary: RIGHT: - There is no evidence of deep vein thrombosis in the lower extremity.  - A cystic structure is found in the popliteal fossa.  LEFT: - There is no evidence of deep vein thrombosis in the lower extremity.  *See table(s) above for measurements and observations. Electronically signed by Norman Serve on 04/27/2024 at 6:29:33 PM.    Final      Assessment/Plan:  78yo M with klebsiella bacteremia, colonized vs. infection with enterococcus on urine culture. Appears improving with exception to having diarrhea today. - suspect this is abtx associated diarrhea - recommend at this time not to test for cdifficile, continue with supportive care - will recommend to change iv ceftriaxone  to unasyn . Which would treat both the klebsiella and enterococcal UTI. Give for additional 3 days to complete full course for bacteremia/uti   Dr. Eben available for questions over the weekend  I have personally spent 85 minutes involved in face-to-face and non-face-to-face activities for this patient on the day of the visit. Professional time spent includes the following activities: Preparing to see the patient (review of tests), Obtaining and/or reviewing separately obtained history (admission/discharge record), Performing a medically appropriate examination and/or evaluation , Ordering medications/tests/procedures, referring and communicating with other health care professionals,  Documenting clinical information in the EMR, Independently interpreting results (not separately reported), Communicating results to the patient.   Montie FURY Luiz MD MPH [facility] [phone]

## 2024-04-29 DIAGNOSIS — R7881 Bacteremia: Secondary | ICD-10-CM | POA: Diagnosis not present

## 2024-04-29 LAB — BASIC METABOLIC PANEL WITH GFR
Anion gap: 12 (ref 5–15)
BUN: 29 mg/dL — ABNORMAL HIGH (ref 8–23)
CO2: 24 mmol/L (ref 22–32)
Calcium: 8.6 mg/dL — ABNORMAL LOW (ref 8.9–10.3)
Chloride: 107 mmol/L (ref 98–111)
Creatinine, Ser: 2.05 mg/dL — ABNORMAL HIGH (ref 0.61–1.24)
GFR, Estimated: 33 mL/min — ABNORMAL LOW (ref >60)
Glucose, Bld: 94 mg/dL (ref 70–99)
Potassium: 4.2 mmol/L (ref 3.5–5.1)
Sodium: 143 mmol/L (ref 135–145)

## 2024-04-29 LAB — CBC
HCT: 35.2 % — ABNORMAL LOW (ref 39.0–52.0)
Hemoglobin: 11.9 g/dL — ABNORMAL LOW (ref 13.0–17.0)
MCH: 32.4 pg (ref 26.0–34.0)
MCHC: 33.8 g/dL (ref 30.0–36.0)
MCV: 95.9 fL (ref 80.0–100.0)
Platelets: 195 K/uL (ref 150–400)
RBC: 3.67 MIL/uL — ABNORMAL LOW (ref 4.22–5.81)
RDW: 12.4 % (ref 11.5–15.5)
WBC: 8.9 K/uL (ref 4.0–10.5)
nRBC: 0 % (ref 0.0–0.2)

## 2024-04-29 LAB — MAGNESIUM: Magnesium: 1.5 mg/dL — ABNORMAL LOW (ref 1.7–2.4)

## 2024-04-29 MED ORDER — ENSURE PLUS HIGH PROTEIN PO LIQD: 237.0000 mL | Freq: Three times a day (TID) | ORAL | 0 refills | Status: DC

## 2024-04-29 MED ORDER — PSYLLIUM 95 % PO PACK
1.0000 | PACK | Freq: Every day | ORAL | 0 refills | Status: AC
Start: 2024-04-30 — End: ?

## 2024-04-29 MED ORDER — AMOXICILLIN-POT CLAVULANATE 875-125 MG PO TABS: 1.0000 | ORAL_TABLET | Freq: Two times a day (BID) | ORAL | 0 refills | Status: AC

## 2024-04-29 NOTE — Discharge Summary (Signed)
 Physician Discharge Summary   Patient: Gavin Hernandez MRN: [id number] DOB: 1946/01/08  Admit date:     04/24/2024  Discharge date: 04/29/24  Discharge Physician: Delon Herald   PCP: Chrystal Lamarr RAMAN, MD   Recommendations at discharge:   Continue Augmentin  twice daily for 3 more days Follow up with surgery next week as scheduled Follow up with urology next week as scheduled Follow up with Dr. Chrystal in 1-2 weeks  Discharge Diagnoses: Principal Problem:   Bacteremia due to Gram-negative bacteria Active Problems:   Hypercholesterolemia   UTI (urinary tract infection)   AKI (acute kidney injury) (HCC)   Diarrhea   S/P hemorrhoidectomy   Chronic kidney disease, stage 3a (HCC)   Edema   BPH (benign prostatic hyperplasia)   PAF (paroxysmal atrial fibrillation) [facility])    Hospital Course: 78yo with h/o AAA, afib not on AC, RCC s/p R nephrectomy, stage 3a CKD, and urinary retention s/p indwelling foley who presented on 6/30 with fever.  He was found to have Klebsiella bacteremia, treated with Ceftriaxone .  Also with AKI on CKD, receiving IVF.  Foley was exchanged.  He had recent hemorrhoidectomy and does have loose stools at baseline; this significantly worsened on 7/4 AM.  ID was consulted.  Assessment and Plan:  Catheter associated urinary tract infection, POA Klebsiella bacteremia Patient has chronically indwelling catheter for BPH Now presenting with urinary tract infection S/p Foley catheter exchange Continue Ceftriaxone  Urine culture with E faecalis, blood cultures with Klebsiella oxytoca Urine culture may be a contaminant given indwelling foley ID consulted on 7/4 and recommended 3 more days of Unasyn  to cover both organisms Has f/u appointment scheduled with [facility] on Tuesday, July 8 He is feeling better and so I reached out to Dr. Luiz; it is reasonable to transition to PO Augmentin  and dc to home today   Diarrhea Recent hemorrhoidectomy Current  diarrhea could be related to this vs. Antibiotic effect vs. Infectious etiology Will add Metamucil for now ID consulted, Dr. 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 Baseline creatinine 1.6, admission creatinine 2.08 > 2.25 Continues to improve, near baseline now Renal US  with slight ectasia of the left-sided collecting symptom Will eventually need f/u   Edema Developed anasarca on 7/2, thought to be related to excessive IVF and so these were discontinued Noted to have significant RLE edema compared to L on 7/3 DVT US  negative Had R ankle pain with edema without warmth or erythema to raise concern for septic arthritis or gout currently Improving  Ambulated 350 ft yesterday with mobility specialist   History of BPH Has chronic indwelling Foley catheter Continue Flomax   Follow-up outpatient with urology next week as scheduled   Paroxysmal A-fib Toprol -XL on hold due to soft blood pressure Not on chronic anticoagulation   Essential hypertension Continue to hold losartan  Ok to resume Toprol  XL at discharge   Hyperlipidemia Continue atorvastatin    GERD Continue Pepcid  and Protonix    Generalized weakness PT/OT consulted No f/u recommended Continue Ensure Plus High Protein       Consultants: PT OT   Procedures: None   Antibiotics: Cefepime  x 1 Ceftriaxone  7/1-4 Unasyn  7/4-5 Augmentin  7/5-8   Pain control - [place]  Controlled Substance Reporting System database was reviewed. and patient was instructed, not to drive, operate heavy machinery, perform activities at heights, swimming or participation in water activities or provide baby-sitting  services while on Pain, Sleep and Anxiety Medications; until their outpatient Physician has advised to do so again. Also recommended to not to take more than prescribed Pain, Sleep and Anxiety  Medications.   Disposition: Home Diet recommendation:  Regular diet DISCHARGE MEDICATION: Allergies as of 04/29/2024       Reactions   Hylan G-f 20 Other (See Comments)   UNKNOWN??   Lactose Diarrhea   Sulfa Antibiotics Hives, Itching, Dermatitis        Medication List     PAUSE taking these medications    losartan  25 MG tablet Wait to take this until your doctor or other care provider tells you to start again. Commonly known as: COZAAR  Take 25 mg by mouth daily.       TAKE these medications    amoxicillin -clavulanate 875-125 MG tablet Commonly known as: AUGMENTIN  Take 1 tablet by mouth 2 (two) times daily for 3 days.   atorvastatin  20 MG tablet Commonly known as: LIPITOR Take 20 mg by mouth at bedtime.   Co Q 10 100 MG Caps Take 200 mg by mouth daily.   DRY EYE OMEGA BENEFITS/VIT D-3 PO Take 3 capsules by mouth daily.   famotidine  40 MG tablet Commonly known as: PEPCID  Take 40 mg by mouth every evening.   feeding supplement Liqd Take 237 mLs by mouth 3 (three) times daily between meals.   levocetirizine 5 MG tablet Commonly known as: XYZAL Take 5 mg by mouth at bedtime.   metoprolol  succinate 25 MG 24 hr tablet Commonly known as: TOPROL -XL Take 12.5 mg by mouth in the morning and at bedtime.   Multi For Him 50+ Tabs Take 1 tablet by mouth daily with breakfast.   omeprazole  20 MG capsule Commonly known as: PRILOSEC Take 20 mg by mouth daily before breakfast.   OSTEO BI-FLEX JOINT SHIELD PO Take 1 tablet by mouth 2 (two) times daily.   psyllium 95 % Pack Commonly known as: HYDROCIL/METAMUCIL Take 1 packet by mouth daily. Start taking on: April 30, 2024   Restasis  0.05 % ophthalmic emulsion Generic drug: cycloSPORINE  Place 1 drop into both eyes 2 (two) times daily.   Saw Palmetto 450 MG Caps Take 450 mg by mouth in the morning and at bedtime.   Systane Night 0.3 % Gel ophthalmic ointment Generic drug: hypromellose 1 Application at  bedtime.   tamsulosin  0.4 MG Caps capsule Commonly known as: FLOMAX  Take 0.8 mg by mouth at bedtime.   Tylenol  325 MG tablet Generic drug: acetaminophen  Take 325-650 mg by mouth every 6 (six) hours as needed for mild pain (pain score 1-3) or headache.        Discharge Exam:    Subjective: Diarrhea resolved.  Taking metamucil.  Ankle is less swollen and fells better, ambulating well.  Would like to go home.   Objective: Vitals:   04/28/24 2003 04/29/24 0358  BP: (!) 140/79 (!) 143/81  Pulse: 61 62  Resp: 18 18  Temp: 98 F (36.7 C) 97.9 F (36.6 C)  SpO2: 97% 94%    Intake/Output Summary (Last 24 hours) at 04/29/2024 1223 Last data filed at 04/29/2024 0400 Gross per 24 hour  Intake 340 ml  Output 2150 ml  Net -1810 ml   Filed Weights   04/25/24 1329  Weight: 86.2 kg    Exam:  General:  Appears calm and comfortable and is in NAD Eyes:  EOMI, normal lids, iris ENT:  grossly normal hearing, lips & tongue, mmm Cardiovascular:  RRR,  no m/r/g. Edema is improved. Respiratory:   CTA bilaterally with no wheezes/rales/rhonchi.  Normal respiratory effort. Abdomen:  soft, NT, ND Skin:  no rash or induration seen on limited exam Musculoskeletal:  RLE edema is markedly improved. Mild R ankle edema, no erythema or warmth. Psychiatric:  grossly normal mood and affect, speech fluent and appropriate, AOx3 Neurologic:  CN 2-12 grossly intact, moves all extremities in coordinated fashion  Data Reviewed: I have reviewed the patient's lab results since admission.  Pertinent labs for today include:  BUN 29/Creatinine 2.05/GFR 33; 29/1.72/40 on 7/4 WBC 8.9 Hgb 11.9, stable Repeat blood cultures NTD      Condition at discharge: stable  The results of significant diagnostics from this hospitalization (including imaging, microbiology, ancillary and laboratory) are listed below for reference.   Imaging Studies: VAS US  LOWER EXTREMITY VENOUS (DVT) Result Date: 04/27/2024  Lower  Venous DVT Study Patient Name:  BYREN PANKOW  Date of Exam:   04/27/2024 Medical Rec #: [id number]      Accession #:    [id number] Date of Birth: 04/21/1946      Patient Gender: M Patient Age:   79 years Exam Location:  [facility] Procedure:      VAS US  LOWER EXTREMITY VENOUS (DVT) Referring Phys: DELON Sonya Pucci --------------------------------------------------------------------------------  Indications: Swelling, and Right ankle pain. Other Indications: Urinary retention s/p indwelling Foley, presenting with fever                    and chills, found to have catheter associated UTI Risk Factors: Renal cancer s/p right-sided nephrectomy. Limitations: Poor ultrasound/tissue interface and edema. Comparison Study: No prior study on file Performing Technologist: Alberta Lis RVS  Examination Guidelines: A complete evaluation includes B-mode imaging, spectral Doppler, color Doppler, and power Doppler as needed of all accessible portions of each vessel. Bilateral testing is considered an integral part of a complete examination. Limited examinations for reoccurring indications may be performed as noted. The reflux portion of the exam is performed with the patient in reverse Trendelenburg.  +---------+---------------+---------+-----------+----------+--------------+ RIGHT    CompressibilityPhasicitySpontaneityPropertiesThrombus Aging +---------+---------------+---------+-----------+----------+--------------+ CFV      Full           Yes      No                                  +---------+---------------+---------+-----------+----------+--------------+ SFJ      Full                                                        +---------+---------------+---------+-----------+----------+--------------+ FV Prox  Full                                                        +---------+---------------+---------+-----------+----------+--------------+ FV Mid   Full                                                         +---------+---------------+---------+-----------+----------+--------------+  FV DistalFull                                                        +---------+---------------+---------+-----------+----------+--------------+ PFV      Full                                                        +---------+---------------+---------+-----------+----------+--------------+ POP      Full           Yes      No                                  +---------+---------------+---------+-----------+----------+--------------+ Gastroc  Full                                                        +---------+---------------+---------+-----------+----------+--------------+ SSV      Full                                         thickened      +---------+---------------+---------+-----------+----------+--------------+   +---------+---------------+---------+-----------+----------+--------------+ LEFT     CompressibilityPhasicitySpontaneityPropertiesThrombus Aging +---------+---------------+---------+-----------+----------+--------------+ CFV      Full           Yes      No                                  +---------+---------------+---------+-----------+----------+--------------+ SFJ      Full                                                        +---------+---------------+---------+-----------+----------+--------------+ FV Prox  Full                                                        +---------+---------------+---------+-----------+----------+--------------+ FV Mid   Full                                                        +---------+---------------+---------+-----------+----------+--------------+ FV DistalFull           Yes      No                                  +---------+---------------+---------+-----------+----------+--------------+ PFV  Full                                                         +---------+---------------+---------+-----------+----------+--------------+ POP      Full                                                        +---------+---------------+---------+-----------+----------+--------------+ PTV      Full                                                        +---------+---------------+---------+-----------+----------+--------------+ PERO     Full                                                        +---------+---------------+---------+-----------+----------+--------------+ Gastroc  Full                                                        +---------+---------------+---------+-----------+----------+--------------+ SSV      Full                                         thickened      +---------+---------------+---------+-----------+----------+--------------+     Summary: RIGHT: - There is no evidence of deep vein thrombosis in the lower extremity.  - A cystic structure is found in the popliteal fossa.  LEFT: - There is no evidence of deep vein thrombosis in the lower extremity.  *See table(s) above for measurements and observations. Electronically signed by Norman Serve on 04/27/2024 at 6:29:33 PM.    Final    US  RENAL Result Date: 04/25/2024 CLINICAL DATA:  Acute kidney injury.  Previous nephrectomy EXAM: RENAL / URINARY TRACT ULTRASOUND COMPLETE COMPARISON:  CT 01/25/2024 abdomen FINDINGS: Right Kidney: Surgically absent as per history Left Kidney: Renal measurements: 14.2 x 8.0 x 7.1 cm = volume: 422.6 mL. 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 This appears simple. Through transmission. Thin wall. Smaller parenchymal cyst identified measuring 13 mm. Unchanged from prior. Slight ectasia of the calices. No perinephric fluid. Bladder: Contracted Other: None. 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 Please correlate for etiology. Further workup when clinically  appropriate Electronically Signed   By: Ranell Bring M.D.   On: 04/25/2024 15:20   DG Chest Port 1 View Result Date: 04/24/2024 CLINICAL DATA:  Question sepsis. EXAM: PORTABLE CHEST 1 VIEW COMPARISON:  None Available. FINDINGS: Lordotic positioning. Normal mediastinum and cardiac silhouette. Normal pulmonary vasculature. No evidence of effusion, infiltrate, or pneumothorax. No acute bony abnormality. IMPRESSION:  No acute cardiopulmonary process. Electronically Signed   By: Jackquline Boxer M.D.   On: 04/24/2024 15:26    Microbiology: Results for orders placed or performed during the hospital encounter of 04/24/24  Blood Culture (routine x 2)     Status: Abnormal   Collection Time: 04/24/24  3:10 PM   Specimen: BLOOD  Result Value Ref Range Status   Specimen Description   Final    BLOOD RIGHT ANTECUBITAL Performed at [facility], [address] [place], [place] [address]    Special Requests   Final    BOTTLES DRAWN AEROBIC AND ANAEROBIC Blood Culture adequate volume Performed at [facility], [address], [place], [place] [address]    Culture  Setup Time   Final    GRAM NEGATIVE RODS AEROBIC BOTTLE ONLY CRITICAL VALUE NOTED.  VALUE IS CONSISTENT WITH PREVIOUSLY REPORTED AND CALLED VALUE.    Culture (A)  Final    KLEBSIELLA OXYTOCA SUSCEPTIBILITIES PERFORMED ON PREVIOUS CULTURE WITHIN THE LAST 5 DAYS. Performed at [facility], [address], [place], [place] [address]    Report Status 04/27/2024 FINAL  Final  Blood Culture (routine x 2)     Status: Abnormal   Collection Time: 04/24/24  3:10 PM   Specimen: BLOOD  Result Value Ref Range Status   Specimen Description   Final    BLOOD LEFT ANTECUBITAL Performed at [facility], [address] [place], [place] [address]    Special Requests   Final    BOTTLES DRAWN AEROBIC AND ANAEROBIC Blood Culture adequate volume Performed at [facility],  [address], [place], [place] [address]    Culture  Setup Time   Final    GRAM NEGATIVE RODS ANAEROBIC BOTTLE ONLY CRITICAL RESULT CALLED TO, READ BACK BY AND VERIFIED WITH: PHARMD JUSTIN L [id number]  FCP Performed at [facility], [address], [place], [place] [address]    Culture KLEBSIELLA OXYTOCA (A)  Final   Report Status 04/27/2024 FINAL  Final   Organism ID, Bacteria KLEBSIELLA OXYTOCA  Final      Susceptibility   Klebsiella oxytoca - MIC*    AMPICILLIN  RESISTANT Resistant     CEFEPIME  <=0.12 SENSITIVE Sensitive     CEFTAZIDIME <=1 SENSITIVE Sensitive     CEFTRIAXONE  <=0.25 SENSITIVE Sensitive     CIPROFLOXACIN <=0.25 SENSITIVE Sensitive     GENTAMICIN <=1 SENSITIVE Sensitive     IMIPENEM 0.5 SENSITIVE Sensitive     TRIMETH/SULFA <=20 SENSITIVE Sensitive     AMPICILLIN /SULBACTAM 8 SENSITIVE Sensitive     PIP/TAZO <=4 SENSITIVE Sensitive ug/mL    * KLEBSIELLA OXYTOCA  Blood Culture ID Panel (Reflexed)     Status: Abnormal   Collection Time: 04/24/24  3:10 PM  Result Value Ref Range Status   Enterococcus faecalis NOT DETECTED NOT DETECTED Final   Enterococcus Faecium NOT DETECTED NOT DETECTED Final   Listeria monocytogenes NOT DETECTED NOT DETECTED Final   Staphylococcus species NOT DETECTED NOT DETECTED Final   Staphylococcus aureus (BCID) NOT DETECTED NOT DETECTED Final   Staphylococcus epidermidis NOT DETECTED NOT DETECTED Final   Staphylococcus lugdunensis NOT DETECTED NOT DETECTED Final   Streptococcus species NOT DETECTED NOT DETECTED Final   Streptococcus agalactiae NOT DETECTED NOT DETECTED Final   Streptococcus pneumoniae NOT DETECTED NOT DETECTED Final   Streptococcus pyogenes NOT DETECTED NOT DETECTED Final   A.calcoaceticus-baumannii NOT DETECTED NOT DETECTED Final   Bacteroides fragilis NOT DETECTED NOT DETECTED Final  Enterobacterales DETECTED (A) NOT DETECTED Final    Comment: Enterobacterales represent a large order of gram negative  bacteria, not a single organism. CRITICAL RESULT CALLED TO, READ BACK BY AND VERIFIED WITH: PHARMD JUSTIN L [id number]  FCP    Enterobacter cloacae complex NOT DETECTED NOT DETECTED Final   Escherichia coli NOT DETECTED NOT DETECTED Final   Klebsiella aerogenes NOT DETECTED NOT DETECTED Final   Klebsiella oxytoca DETECTED (A) NOT DETECTED Final    Comment: CRITICAL RESULT CALLED TO, READ BACK BY AND VERIFIED WITH: PHARMD JUSTIN L [id number]  FCP    Klebsiella pneumoniae NOT DETECTED NOT DETECTED Final   Proteus species NOT DETECTED NOT DETECTED Final   Salmonella species NOT DETECTED NOT DETECTED Final   Serratia marcescens NOT DETECTED NOT DETECTED Final   Haemophilus influenzae NOT DETECTED NOT DETECTED Final   Neisseria meningitidis NOT DETECTED NOT DETECTED Final   Pseudomonas aeruginosa NOT DETECTED NOT DETECTED Final   Stenotrophomonas maltophilia NOT DETECTED NOT DETECTED Final   Candida albicans NOT DETECTED NOT DETECTED Final   Candida auris NOT DETECTED NOT DETECTED Final   Candida glabrata NOT DETECTED NOT DETECTED Final   Candida krusei NOT DETECTED NOT DETECTED Final   Candida parapsilosis NOT DETECTED NOT DETECTED Final   Candida tropicalis NOT DETECTED NOT DETECTED Final   Cryptococcus neoformans/gattii NOT DETECTED NOT DETECTED Final   CTX-M ESBL NOT DETECTED NOT DETECTED Final   Carbapenem resistance IMP NOT DETECTED NOT DETECTED Final   Carbapenem resistance KPC NOT DETECTED NOT DETECTED Final   Carbapenem resistance NDM NOT DETECTED NOT DETECTED Final   Carbapenem resist OXA 48 LIKE NOT DETECTED NOT DETECTED Final   Carbapenem resistance VIM NOT DETECTED NOT DETECTED Final    Comment: Performed at [facility], [address] [place], [place] [address]  Resp panel by RT-PCR (RSV, Flu A&B, Covid) Anterior Nasal Swab     Status: None   Collection Time: 04/24/24  3:32 PM   Specimen: Anterior Nasal Swab  Result Value Ref Range Status   SARS Coronavirus 2  by RT PCR NEGATIVE NEGATIVE Final    Comment: (NOTE) SARS-CoV-2 target nucleic acids are NOT DETECTED.  The SARS-CoV-2 RNA is generally detectable in upper respiratory specimens during the acute phase of infection. The lowest concentration of SARS-CoV-2 viral copies this assay can detect is 138 copies/mL. A negative result does not preclude SARS-Cov-2 infection and should not be used as the sole basis for treatment or other patient management decisions. A negative result may occur with  improper specimen collection/handling, submission of specimen other than nasopharyngeal swab, presence of viral mutation(s) within the areas targeted by this assay, and inadequate number of viral copies(<138 copies/mL). A negative result must be combined with clinical observations, patient history, and epidemiological information. The expected result is Negative.  Fact Sheet for Patients:  BloggerCourse.com  Fact Sheet for Healthcare Providers:  SeriousBroker.it  This test is no t yet approved or cleared by the [place]  FDA and  has been authorized for detection and/or diagnosis of SARS-CoV-2 by FDA under an Emergency Use Authorization (EUA). This EUA will remain  in effect (meaning this test can be used) for the duration of the COVID-19 declaration under Section 564(b)(1) of the Act, 21 U.S.C.section 360bbb-3(b)(1), unless the authorization is terminated  or revoked sooner.       Influenza A by PCR NEGATIVE NEGATIVE Final   Influenza B by PCR NEGATIVE NEGATIVE Final    Comment: (NOTE) The  Xpert Xpress SARS-CoV-2/FLU/RSV plus assay is intended as an aid in the diagnosis of influenza from Nasopharyngeal swab specimens and should not be used as a sole basis for treatment. Nasal washings and aspirates are unacceptable for Xpert Xpress SARS-CoV-2/FLU/RSV testing.  Fact Sheet for Patients: BloggerCourse.com  Fact  Sheet for Healthcare Providers: SeriousBroker.it  This test is not yet approved or cleared by the [place]  FDA and has been authorized for detection and/or diagnosis of SARS-CoV-2 by FDA under an Emergency Use Authorization (EUA). This EUA will remain in effect (meaning this test can be used) for the duration of the COVID-19 declaration under Section 564(b)(1) of the Act, 21 U.S.C. section 360bbb-3(b)(1), unless the authorization is terminated or revoked.     Resp Syncytial Virus by PCR NEGATIVE NEGATIVE Final    Comment: (NOTE) Fact Sheet for Patients: BloggerCourse.com  Fact Sheet for Healthcare Providers: SeriousBroker.it  This test is not yet approved or cleared by the [place]  FDA and has been authorized for detection and/or diagnosis of SARS-CoV-2 by FDA under an Emergency Use Authorization (EUA). This EUA will remain in effect (meaning this test can be used) for the duration of the COVID-19 declaration under Section 564(b)(1) of the Act, 21 U.S.C. section 360bbb-3(b)(1), unless the authorization is terminated or revoked.  Performed at [facility], [address], [place], [place] [address]   Urine Culture (for pregnant, neutropenic or urologic patients or patients with an indwelling urinary catheter)     Status: Abnormal   Collection Time: 04/24/24  9:32 PM   Specimen: Urine, Catheterized  Result Value Ref Range Status   Specimen Description   Final    URINE, CATHETERIZED Performed at [facility], [address] [place], [place] [address]    Special Requests   Final    NONE Performed at [facility], [address], [place], [place] [address]    Culture 80,000 COLONIES/mL ENTEROCOCCUS FAECALIS (A)  Final   Report Status 04/27/2024 FINAL  Final   Organism ID, Bacteria ENTEROCOCCUS FAECALIS (A)  Final      Susceptibility    Enterococcus faecalis - MIC*    AMPICILLIN  <=2 SENSITIVE Sensitive     NITROFURANTOIN <=16 SENSITIVE Sensitive     VANCOMYCIN 1 SENSITIVE Sensitive     * 80,000 COLONIES/mL ENTEROCOCCUS FAECALIS  Culture, blood (Routine X 2) w Reflex to ID Panel     Status: None (Preliminary result)   Collection Time: 04/27/24  9:43 AM   Specimen: BLOOD LEFT HAND  Result Value Ref Range Status   Specimen Description   Final    BLOOD LEFT HAND Performed at [facility], [address] [place], [place] [address]    Special Requests   Final    BOTTLES DRAWN AEROBIC AND ANAEROBIC Blood Culture results may not be optimal due to an inadequate volume of blood received in culture bottles Performed at [facility], [address], [place], [place] [address]    Culture   Final    NO GROWTH 2 DAYS Performed at [facility], [address] [place], [place] [address]    Report Status PENDING  Incomplete  Culture, blood (Routine X 2) w Reflex to ID Panel     Status: None (Preliminary result)   Collection Time: 04/27/24  9:55 AM   Specimen: BLOOD RIGHT HAND  Result Value Ref Range Status   Specimen Description   Final    BLOOD RIGHT HAND Performed at  [facility], [address] [place], [place] [address]    Special Requests   Final    BOTTLES DRAWN AEROBIC AND ANAEROBIC Blood Culture results may not be optimal due to an inadequate volume of blood received in culture bottles Performed at [facility], [address] [place], [place] [address]    Culture   Final    NO GROWTH 2 DAYS Performed at [facility], [address] [place], [place] [address]    Report Status PENDING  Incomplete    Labs: CBC: Recent Labs  Lab 04/24/24 1500 04/25/24 0444 04/26/24 0435 04/27/24 0415 04/28/24 0421 04/29/24 0354  WBC 11.0* 21.3* 13.1* 11.1* 8.8 8.9  NEUTROABS 8.4*  --   --   --   --   --   HGB 12.4* 11.5* 11.3* 11.2* 11.7* 11.9*  HCT 36.5*  34.4* 33.4* 33.6* 35.6* 35.2*  MCV 94.3 96.9 97.1 95.5 96.7 95.9  PLT 220 193 164 180 197 195   Basic Metabolic Panel: Recent Labs  Lab 04/25/24 0444 04/26/24 0435 04/27/24 0415 04/28/24 0421 04/29/24 0354  NA 135 137 139 142 143  K 4.2 4.0 4.1 4.2 4.2  CL 103 106 107 106 107  CO2 21* 23 23 22 24   GLUCOSE 128* 97 114* 95 94  BUN 38* 40* 39* 29* 29*  CREATININE 2.25* 2.26* 2.13* 1.72* 2.05*  CALCIUM  8.3* 8.2* 8.3* 8.7* 8.6*  MG  --  1.7 1.6* 1.5* 1.5*  PHOS  --  3.6  --   --   --    Liver Function Tests: Recent Labs  Lab 04/24/24 1500  AST 23  ALT 21  ALKPHOS 65  BILITOT 0.7  PROT 6.0*  ALBUMIN 3.5   CBG: No results for input(s): GLUCAP in the last 168 hours.  Discharge time spent: greater than 30 minutes.  Signed: Delon Herald, MD [facility] 04/29/2024

## 2024-04-29 NOTE — TOC Transition Note (Signed)
 Transition of Care [facility]) - Discharge Note   Patient Details  Name: Gavin Hernandez MRN: [id number] Date of Birth: [birth year]  [facility] CM/SW Contact:  Sonda Manuella Quill, RN Phone Number: 04/29/2024, 12:56 PM   Clinical Narrative:    D/C orders received; no TOC needs.   Final next level of care: Home/Self Care Barriers to Discharge: No Barriers Identified   Patient Goals and CMS Choice Patient states their goals for this hospitalization and ongoing recovery are:: Home with wife CMS Medicare.gov Compare Post Acute Care list provided to::  (NA) Choice offered to / list presented to : NA      Discharge Placement                       Discharge Plan and Services Additional resources added to the After Visit Summary for   In-house Referral: NA Discharge Planning Services: CM Consult Post Acute Care Choice: NA          DME Arranged: N/A DME Agency: NA       HH Arranged: NA HH Agency: NA        Social Drivers of Health (SDOH) Interventions SDOH Screenings   Food Insecurity: No Food Insecurity (04/24/2024)  Housing: Low Risk  (04/24/2024)  Transportation Needs: No Transportation Needs (04/24/2024)  Utilities: Not At Risk (04/24/2024)  Depression (PHQ2-9): Low Risk  (05/20/2022)  Social Connections: Moderately Integrated (04/24/2024)  Tobacco Use: Low Risk  (04/24/2024)     Readmission Risk Interventions    04/25/2024    3:37 PM  Readmission Risk Prevention Plan  Post Dischage Appt Complete  Medication Screening Complete  Transportation Screening Complete

## 2024-04-29 NOTE — Plan of Care (Signed)

## 2024-05-02 LAB — CULTURE, BLOOD (ROUTINE X 2)
Culture: NO GROWTH
Culture: NO GROWTH

## 2024-07-10 ENCOUNTER — Ambulatory Visit: Admitting: Internal Medicine

## 2024-07-21 ENCOUNTER — Ambulatory Visit (INDEPENDENT_AMBULATORY_CARE_PROVIDER_SITE_OTHER): Admitting: Otolaryngology

## 2024-07-21 VITALS — BP 118/81 | HR 65 | Temp 97.7°F | Ht 69.0 in | Wt 182.0 lb

## 2024-07-21 DIAGNOSIS — R053 Chronic cough: Secondary | ICD-10-CM | POA: Diagnosis not present

## 2024-07-21 NOTE — Progress Notes (Signed)
 Procedure note: Superior laryngeal nerve block (35591-49) and steroid injection  ATTENDING: Elena Larry, M.D.  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 The risks and benefits of the surgical procedure have been explained in detail to the patient and they have elected to proceed.  CONSENT:  Informed consent was obtained prior to the procedure after discussion of risks, benefits, and alternatives and expected outcomes were discussed with the patient; consent placed in chart. The possibilities of reaction to medication, increase in pain, bleeding, infection, prolonged numbness or dysphagia, inadequate treatment response, continued or increased pain, the need for additional procedures, failure to diagnose a condition, and creating a complication requiring transfusion or operation or additional intervention were discussed with the patient. The patient concurred with the proposed plan, giving informed consent.    UNIVERSAL PROTOCOL/ TIMEOUT: Preprocedure verification is complete- patient verified and consents confirmed.  H&P REVIEW: The patient's history and physical were reviewed today prior to procedure. All medications were reviewed and updated as well.  ANESTHESIA: lidocaine  1% without epi  PROCEDURE DETAILS: The patient was brought to the clinic and placed in a seated position.  The thyroid  cartilage and hyoid bone were palpated and the thyrohyoid membrane was identified. 1ml of kenalog  40mg /ml and 1ml of local anesthetic were mixed and injected at the site of the left and right superior laryngeal nerve entry through the thyrohyoid membrane, taking great care to  avoid intra-arterial injection. This concluded the procedure. There were no complications and the patient tolerated the procedure well.  ESTIMATED BLOOD LOSS: Minimal  FINDINGS:  No evidence of significant bleeding or other complication  CONDITION: Stable  COMPLICATIONS: no significant complications  DISPOSITION: The patient tolerated the procedure well without apparent complications and was ambulatory.

## 2024-08-04 NOTE — Progress Notes (Signed)
 Cardiology Office Note:   Date:  08/11/2024  ID:  Gavin Hernandez, DOB 10-24-1946, MRN [id number] PCP:  Chrystal [place] RAMAN, MD  [facility] Providers Cardiologist:  Wendel Haws, MD Referring MD: Chrystal [place] RAMAN, *  Chief Complaint/Reason for Referral: Follow-up diastolic dysfunction ASSESSMENT:    1. Diastolic dysfunction   2. Hyperlipidemia LDL goal <70   3. Aortic atherosclerosis   4. Stage 3b chronic kidney disease (HCC)     PLAN:   In order of problems listed above: Diastolic dysfunction: Continue Toprol  12.5 mg, continue losartan  25 mg , defer SGLT2 inhibitor due to history of urinary tract infections. Hyperlipidemia: Continue atorvastatin  20 mg.  Check lipid panel, LFTs, LP(a) today Aortic atherosclerosis: Continue aspirin 81 mg, atorvastatin  20 mg CKD stage IIIb: Continue losartan  25 mg; defer SGLT2 inhibitor. Hypertension: BP is well-controlled today.  Continue losartan  25 mg, Toprol  25 mg.            Dispo:  Return in about 6 months (around 02/09/2025).       I spent 33 minutes reviewing all clinical data during and prior to this visit including all relevant imaging studies, laboratories, clinical information from other health systems and prior notes from both Cardiology and other specialties, interviewing the patient, conducting a complete physical examination, and coordinating care in order to formulate a comprehensive and personalized evaluation and treatment plan.   History of Present Illness:    FOCUSED PROBLEM LIST:   Polycythemia vera Diastolic dysfunction G1 DD, mild MR, EF 60 to 65% TTE 2023 Hyperlipidemia Aortic atherosclerosis CT abdomen pelvis 2022 Hypertension Renal cell carcinoma S/p R kidney resection CKD stage IIIb Defer SGLT2 inhibitor due to history of UTIs BMI 21 November 2021: The patient is a 78 y.o. male with the indicated medical history here to establish cardiovascular care.  He has known murmur that has been  relatively stable for some time.  He is relatively active and reports no significant exertional dyspnea, chest pain, palpitations, presyncope, syncope, orthopnea, signs or symptoms of stroke, or need for urgent emergency room evaluation or hospitalization.  He is compliant with his medications and per report, his blood pressure is fairly well controlled.  Plan obtain echocardiogram.  October 2025:  Patient consents to use of AI scribe. In the interim the patient had an echocardiogram which demonstrated grade 1 diastolic dysfunction, preserved ejection fraction, and mild mitral regurgitation.  The patient was last seen in April 2024.  At that point in time he was doing well without cardiovascular complaints.  He experienced significant urinary issues following a surgery, which led to catheter use and subsequent urinary tract infections. He recalls waking up in the emergency room due to an infection, which was a challenging experience. He is concerned about the potential for future urinary tract infections, especially in relation to new medications. No current issues with urination.  He is actively engaged in physical exercise and is working to regain his fitness level after recent health challenges. He notes that he is almost back to his full exercise routine, which includes a significant number of crunches daily.     Current Medications: Current Meds  Medication Sig   atorvastatin  (LIPITOR) 20 MG tablet Take 20 mg by mouth at bedtime.   Coenzyme Q10 (CO Q 10) 100 MG CAPS Take 200 mg by mouth daily.   famotidine  (PEPCID ) 40 MG tablet Take 40 mg by mouth every evening.   levocetirizine (XYZAL) 5 MG tablet Take 5 mg by mouth at bedtime.  losartan  (COZAAR ) 25 MG tablet Take 25 mg by mouth daily.   metoprolol  succinate (TOPROL -XL) 25 MG 24 hr tablet Take 12.5 mg by mouth in the morning and at bedtime.   MIEBO 1.338 GM/ML SOLN    Misc Natural Products (OSTEO BI-FLEX JOINT SHIELD PO) Take 1 tablet by  mouth 2 (two) times daily.   Multiple Vitamins-Minerals (MULTI FOR HIM 50+) TABS Take 1 tablet by mouth daily with breakfast.   Omega-3 Fat Ac-Cholecalciferol (DRY EYE OMEGA BENEFITS/VIT D-3 PO) Take 3 capsules by mouth daily.   omeprazole  (PRILOSEC) 20 MG capsule Take 20 mg by mouth daily before breakfast.   RESTASIS  0.05 % ophthalmic emulsion Place 1 drop into both eyes 2 (two) times daily.   Saw Palmetto 450 MG CAPS Take 450 mg by mouth in the morning and at bedtime.   SYSTANE NIGHT 0.3 % GEL ophthalmic ointment 1 Application at bedtime.     Review of Systems:   Please see the history of present illness.    All other systems reviewed and are negative.     EKGs/Labs/Other Test Reviewed:   EKG: 2025 sinus rhythm with PACs  EKG Interpretation Date/Time:    Ventricular Rate:    PR Interval:    QRS Duration:    QT Interval:    QTC Calculation:   R Axis:      Text Interpretation:          CARDIAC STUDIES: Refer to CV Procedures and Imaging Tabs   Risk Assessment/Calculations:          Physical Exam:   VS:  BP 122/62   Pulse 65   Ht 5' 9 (1.753 m)   Wt 183 lb 9.6 oz (83.3 kg)   SpO2 97%   BMI 27.11 kg/m        Wt Readings from Last 3 Encounters:  08/11/24 183 lb 9.6 oz (83.3 kg)  07/21/24 182 lb (82.6 kg)  04/25/24 190 lb 1.6 oz (86.2 kg)      GENERAL:  No apparent distress, AOx3 HEENT:  No carotid bruits, +2 carotid impulses, no scleral icterus CAR: RRR no murmurs, gallops, rubs, or thrills RES:  Clear to auscultation bilaterally ABD:  Soft, nontender, nondistended, positive bowel sounds x 4 VASC:  +2 radial pulses, +2 carotid pulses NEURO:  CN 2-12 grossly intact; motor and sensory grossly intact PSYCH:  No active depression or anxiety EXT:  No edema, ecchymosis, or cyanosis  Signed, Decarlos Empey K Nikya Busler, MD  08/11/2024 8:57 AM    [facility] [address] [place], [place], [place]  [address] Phone: [phone]; Fax: [phone]    Note:  This document was prepared using Dragon voice recognition software and may include unintentional dictation errors.

## 2024-08-11 ENCOUNTER — Ambulatory Visit: Attending: Internal Medicine | Admitting: Internal Medicine

## 2024-08-11 ENCOUNTER — Encounter: Payer: Self-pay | Admitting: Internal Medicine

## 2024-08-11 VITALS — BP 122/62 | HR 65 | Ht 69.0 in | Wt 183.6 lb

## 2024-08-11 DIAGNOSIS — Z79899 Other long term (current) drug therapy: Secondary | ICD-10-CM | POA: Insufficient documentation

## 2024-08-11 DIAGNOSIS — I700 Atherosclerosis of aorta: Secondary | ICD-10-CM | POA: Diagnosis present

## 2024-08-11 DIAGNOSIS — N1832 Chronic kidney disease, stage 3b: Secondary | ICD-10-CM | POA: Diagnosis present

## 2024-08-11 DIAGNOSIS — E785 Hyperlipidemia, unspecified: Secondary | ICD-10-CM | POA: Diagnosis present

## 2024-08-11 DIAGNOSIS — I5189 Other ill-defined heart diseases: Secondary | ICD-10-CM | POA: Diagnosis present

## 2024-08-11 LAB — HEPATIC FUNCTION PANEL
ALT: 29 IU/L (ref 0–44)
AST: 28 IU/L (ref 0–40)
Albumin: 4.5 g/dL (ref 3.8–4.8)
Alkaline Phosphatase: 60 IU/L (ref 47–123)
Bilirubin Total: 0.7 mg/dL (ref 0.0–1.2)
Bilirubin, Direct: 0.23 mg/dL (ref 0.00–0.40)
Total Protein: 6.5 g/dL (ref 6.0–8.5)

## 2024-08-11 LAB — LIPID PANEL
Chol/HDL Ratio: 2.7 ratio (ref 0.0–5.0)
Cholesterol, Total: 130 mg/dL (ref 100–199)
HDL: 48 mg/dL (ref >39)
LDL Chol Calc (NIH): 69 mg/dL (ref 0–99)
Triglycerides: 59 mg/dL (ref 0–149)
VLDL Cholesterol Cal: 13 mg/dL (ref 5–40)

## 2024-08-11 NOTE — Patient Instructions (Signed)
 Medication Instructions:  Your physician recommends that you continue on your current medications as directed. Please refer to the Current Medication list given to you today.  *If you need a refill on your cardiac medications before your next appointment, please call your pharmacy*  Lab Work: Please complete a FASTING lipid panel, liver function test and lipoprotein a in our first floor lab today before you leave.  If you have labs (blood work) drawn today and your tests are completely normal, you will receive your results only by: MyChart Message (if you have MyChart) OR A paper copy in the mail If you have any lab test that is abnormal or we need to change your treatment, we will call you to review the results.  Testing/Procedures: None.  Follow-Up: At [facility], you and your health needs are our priority.  As part of our continuing mission to provide you with exceptional heart care, our providers are all part of one team.  This team includes your primary Cardiologist (physician) and Advanced Practice Providers or APPs (Physician Assistants and Nurse Practitioners) who all work together to provide you with the care you need, when you need it.  Your next appointment:   6 month(s)  Provider:   Glendia Ferrier, PA-C

## 2024-08-13 ENCOUNTER — Ambulatory Visit: Payer: Self-pay | Admitting: Internal Medicine

## 2024-08-14 LAB — LIPOPROTEIN A (LPA): Lipoprotein (a): 15 nmol/L (ref <75.0)

## 2024-08-16 ENCOUNTER — Telehealth (INDEPENDENT_AMBULATORY_CARE_PROVIDER_SITE_OTHER): Payer: Self-pay

## 2024-08-16 NOTE — Telephone Encounter (Signed)
 Left vm to r/s 09/05/24 appointment

## 2024-08-16 NOTE — Telephone Encounter (Signed)
 Last telephone encounter was incorrect, left vm to r/s 09/07/24 appointment

## 2024-09-06 ENCOUNTER — Telehealth (INDEPENDENT_AMBULATORY_CARE_PROVIDER_SITE_OTHER): Payer: Self-pay | Admitting: Otolaryngology

## 2024-09-06 ENCOUNTER — Encounter (INDEPENDENT_AMBULATORY_CARE_PROVIDER_SITE_OTHER): Payer: Self-pay

## 2024-09-06 NOTE — Telephone Encounter (Signed)
 I left a detailed message on the patient's voicemail for him to call us  back at his earliest availability to reschedule his appointment for 09/07/24.  This needs to be moved to a different provider.

## 2024-09-07 ENCOUNTER — Ambulatory Visit (INDEPENDENT_AMBULATORY_CARE_PROVIDER_SITE_OTHER): Admitting: Otolaryngology

## 2024-09-11 ENCOUNTER — Ambulatory Visit (INDEPENDENT_AMBULATORY_CARE_PROVIDER_SITE_OTHER)

## 2024-09-11 ENCOUNTER — Encounter (INDEPENDENT_AMBULATORY_CARE_PROVIDER_SITE_OTHER): Payer: Self-pay

## 2024-09-11 VITALS — BP 122/72 | HR 59 | Temp 97.8°F | Wt 183.0 lb

## 2024-09-11 DIAGNOSIS — J069 Acute upper respiratory infection, unspecified: Secondary | ICD-10-CM

## 2024-09-11 DIAGNOSIS — R0789 Other chest pain: Secondary | ICD-10-CM

## 2024-09-11 MED ORDER — METHYLPREDNISOLONE 4 MG PO TBPK: ORAL_TABLET | ORAL | 0 refills | Status: AC

## 2024-09-11 NOTE — Progress Notes (Signed)
 HPI:  Discussed the use of AI scribe software for clinical note transcription with the patient, who gave verbal consent to proceed.  History of Present Illness Gavin Hernandez is a 78 year old male who presents with a persistent cough following a cold.  He underwent an SLN block in office on 07/21/2024. He states he has not had any changes in his cough since the injection. He has been experiencing a severe cough for about three to four weeks. The cough is intense enough to cause rib pain and is persistent despite him feeling that he is breathing fairly well. He has been avoiding social activities such as Bible study and church due to the cough.  He recalls having a 'crazy cold' prior to the onset of the persistent cough, with a slight fever for one or two days during this period. No recent weight loss, night sweats, or chills. No issues with breathing or sleeping, although exposure to cold air in the morning exacerbates the cough.  He mentions having received vaccinations, including the pneumonia vaccine, and denies any recent pneumonias. He states that he does feel that he is at the tale end of a URI.       PMH/Meds/All/SocHx/FamHx/ROS: Past Medical History:  Diagnosis Date   AAA (abdominal aortic aneurysm) without rupture    Abnormal echocardiogram    Aortic valve sclerosis    Arthritis    Atrial fibrillation (HCC)    Cancer (HCC)    skin & kidney   Cardiac murmur    Dyslipidemia    GERD (gastroesophageal reflux disease)    History of atrial fibrillation    History of kidney stones    Hypertension    Inguinal hernia    Irregular heart beat    Lipidemia    LVH (left ventricular hypertrophy)    MVP (mitral valve prolapse)    Non-rheumatic mitral regurgitation    Non-rheumatic mitral regurgitation    OSA (obstructive sleep apnea)    Palpitations    PVD (peripheral vascular disease)    Renal disorder    Right renal mass    Secondary polycythemia    Skin cancer    Stage 2  chronic kidney disease    Tremor    Past Surgical History:  Procedure Laterality Date   HERNIA REPAIR     no mesh   KNEE ARTHROSCOPY Left    x2   LUMBAR FUSION     ROBOT ASSISTED LAPAROSCOPIC NEPHRECTOMY Right 08/29/2021   Procedure: XI ROBOTIC ASSISTED LAPAROSCOPIC RADICAL NEPHRECTOMY;  Surgeon: Devere Lonni Righter, MD;  Location: [facility];  Service: Urology;  Laterality: Right;  ONLY NEEDS 180 MIN   SHOULDER ARTHROSCOPY     thumb surgery     No family history of bleeding disorders, wound healing problems or difficulty with anesthesia.  Social Connections: Moderately Integrated (04/24/2024)   Social Connection and Isolation Panel    Frequency of Communication with Friends and Family: Three times a week    Frequency of Social Gatherings with Friends and Family: Three times a week    Attends Religious Services: More than 4 times per year    Active Member of Clubs or Organizations: No    Attends Banker Meetings: Never    Marital Status: Married    Current Outpatient Medications:    atorvastatin  (LIPITOR) 20 MG tablet, Take 20 mg by mouth at bedtime., Disp: , Rfl:    Coenzyme Q10 (CO Q 10) 100 MG CAPS, Take 200 mg by mouth  daily., Disp: , Rfl:    famotidine  (PEPCID ) 40 MG tablet, Take 40 mg by mouth every evening., Disp: , Rfl:    levocetirizine (XYZAL) 5 MG tablet, Take 5 mg by mouth at bedtime., Disp: , Rfl:    losartan  (COZAAR ) 25 MG tablet, Take 25 mg by mouth daily., Disp: , Rfl:    methylPREDNISolone  (MEDROL  DOSEPAK) 4 MG TBPK tablet, Take as indicated on package, Disp: 21 each, Rfl: 0   metoprolol  succinate (TOPROL -XL) 25 MG 24 hr tablet, Take 12.5 mg by mouth in the morning and at bedtime., Disp: , Rfl:    MIEBO 1.338 GM/ML SOLN, , Disp: , Rfl:    Misc Natural Products (OSTEO BI-FLEX JOINT SHIELD PO), Take 1 tablet by mouth 2 (two) times daily., Disp: , Rfl:    Multiple Vitamins-Minerals (MULTI FOR HIM 50+) TABS, Take 1 tablet by mouth daily with breakfast.,  Disp: , Rfl:    Omega-3 Fat Ac-Cholecalciferol (DRY EYE OMEGA BENEFITS/VIT D-3 PO), Take 3 capsules by mouth daily., Disp: , Rfl:    omeprazole  (PRILOSEC) 20 MG capsule, Take 20 mg by mouth daily before breakfast., Disp: , Rfl:    RESTASIS  0.05 % ophthalmic emulsion, Place 1 drop into both eyes 2 (two) times daily., Disp: , Rfl:    Saw Palmetto 450 MG CAPS, Take 450 mg by mouth in the morning and at bedtime., Disp: , Rfl:    SYSTANE NIGHT 0.3 % GEL ophthalmic ointment, 1 Application at bedtime., Disp: , Rfl:    TYLENOL  325 MG tablet, Take 325-650 mg by mouth every 6 (six) hours as needed for mild pain (pain score 1-3) or headache. (Patient not taking: Reported on 09/11/2024), Disp: , Rfl:  A complete ROS was performed with pertinent positives/negatives noted in the HPI. The remainder of the ROS are negative.   Physical Exam:  BP 122/72 (BP Location: Right Arm, Patient Position: Sitting, Cuff Size: Normal)   Pulse (!) 59   Temp 97.8 F (36.6 C)   Wt 183 lb (83 kg)   SpO2 95%   BMI 27.02 kg/m  General: Well developed, well nourished. No acute distress. Head/Face: Normocephalic. No sinus tenderness. Facial nerve intact and equal bilaterally. No facial lacerations. Eyes: PERRL, no scleral icterus or conjunctival hemorrhage. EOMI. Ears: No gross deformity. Normal external canal. Tympanic membrane in tact bilaterally Hearing: Normal speech reception.  Nose: No gross deformity or lesions. No purulent discharge. No turbinate hypertrophy. Mouth/Oropharynx: Lips without any lesions. No mucosal lesions within the oropharynx. No tonsillar enlargement, exudate, or lesions. Pharyngeal walls symmetrical. Uvula midline. Tongue midline without lesions. Larynx: See TFL if applicable Nasopharynx: See TFL if applicable Neck: Trachea midline. No masses. No thyromegaly or nodules palpated. No crepitus. Lymphatic: No lymphadenopathy in the neck. Respiratory: No stridor or distress. Room air. Cardiovascular:  Regular rate and rhythm. Extremities: No edema or cyanosis. Warm and well-perfused. Skin: No scars or lesions on face or neck. Neurologic: CN II-XII grossly intact. Moving all extremities without gross abnormality. Other:  Independent Review of Additional Tests or Records: None Procedures: None Impression & Plans: Assessment & Plan Acute cough with rib pain due to likely viral upper respiratory infection Hx of chronic cough - unresponsive to first SLN block - Prescribed Medrol  Dosepak - given recent URI would recommend reducing upper airway inflammatory response prior to repeat injection for chronic cough - Patient would like to proceed with second injection at next visit after completing steroid taper - Scheduled follow-up in two weeks for second injection.  Follow-up in 2 weeks for repeat SLN block.    Adah Malkin, DO [facility]

## 2024-09-27 ENCOUNTER — Ambulatory Visit (INDEPENDENT_AMBULATORY_CARE_PROVIDER_SITE_OTHER)

## 2024-09-27 ENCOUNTER — Encounter (INDEPENDENT_AMBULATORY_CARE_PROVIDER_SITE_OTHER): Payer: Self-pay

## 2024-10-04 ENCOUNTER — Telehealth: Payer: Self-pay | Admitting: Neurology

## 2024-10-04 NOTE — Telephone Encounter (Signed)
 Wife has scheduled as f/u as a result of symptoms worsening, pt also on wait list

## 2024-10-05 ENCOUNTER — Other Ambulatory Visit: Payer: Self-pay | Admitting: Family Medicine

## 2024-10-05 DIAGNOSIS — R2689 Other abnormalities of gait and mobility: Secondary | ICD-10-CM

## 2024-10-05 DIAGNOSIS — R519 Headache, unspecified: Secondary | ICD-10-CM

## 2024-10-06 ENCOUNTER — Other Ambulatory Visit (HOSPITAL_COMMUNITY): Payer: Self-pay | Admitting: Family Medicine

## 2024-10-06 DIAGNOSIS — R519 Headache, unspecified: Secondary | ICD-10-CM

## 2024-10-06 DIAGNOSIS — R2689 Other abnormalities of gait and mobility: Secondary | ICD-10-CM

## 2024-10-07 ENCOUNTER — Ambulatory Visit (HOSPITAL_COMMUNITY): Admission: RE | Admit: 2024-10-07 | Discharge: 2024-10-07 | Attending: Family Medicine | Admitting: Family Medicine

## 2024-10-07 DIAGNOSIS — R519 Headache, unspecified: Secondary | ICD-10-CM

## 2024-10-07 DIAGNOSIS — R2689 Other abnormalities of gait and mobility: Secondary | ICD-10-CM

## 2024-10-07 MED ORDER — GADOBUTROL 1 MMOL/ML IV SOLN
8.0000 mL | Freq: Once | INTRAVENOUS | Status: AC | PRN
  Administered 2024-10-07: 8 mL via INTRAVENOUS

## 2024-10-20 ENCOUNTER — Ambulatory Visit (INDEPENDENT_AMBULATORY_CARE_PROVIDER_SITE_OTHER)

## 2024-10-20 ENCOUNTER — Encounter (INDEPENDENT_AMBULATORY_CARE_PROVIDER_SITE_OTHER): Payer: Self-pay

## 2024-10-20 VITALS — BP 134/85 | HR 61 | Temp 98.0°F | Wt 183.0 lb

## 2024-10-20 DIAGNOSIS — R053 Chronic cough: Secondary | ICD-10-CM

## 2024-10-20 DIAGNOSIS — Z7722 Contact with and (suspected) exposure to environmental tobacco smoke (acute) (chronic): Secondary | ICD-10-CM

## 2024-10-20 DIAGNOSIS — J069 Acute upper respiratory infection, unspecified: Secondary | ICD-10-CM | POA: Diagnosis not present

## 2024-10-20 DIAGNOSIS — K219 Gastro-esophageal reflux disease without esophagitis: Secondary | ICD-10-CM | POA: Diagnosis not present

## 2024-10-21 NOTE — Progress Notes (Signed)
 HPI:  Discussed the use of AI scribe software for clinical note transcription with the patient, who gave verbal consent to proceed.  History of Present Illness Gavin Hernandez is a 78 year old male who presents with a persistent cough following a cold.  He underwent an SLN block in office on 07/21/2024. He states he has not had any changes in his cough since the injection. He has been experiencing a severe cough for about three to four weeks. The cough is intense enough to cause rib pain and is persistent despite him feeling that he is breathing fairly well. He has been avoiding social activities such as Bible study and church due to the cough.  He recalls having a 'crazy cold' prior to the onset of the persistent cough, with a slight fever for one or two days during this period. No recent weight loss, night sweats, or chills. No issues with breathing or sleeping, although exposure to cold air in the morning exacerbates the cough.  He mentions having received vaccinations, including the pneumonia vaccine, and denies any recent pneumonias. He states that he does feel that he is at the tale end of a URI.   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  Chronic cough - Persistent, non-productive cough for approximately two years - Cough is often severe and occasionally causes chest discomfort - No association with recent upper respiratory symptoms, hemoptysis, shortness of breath, or wheezing - No cough yet today, which is an improvement compared to previous days, though intermittent symptoms persist - Partner describes cough as distressing to witness - Some improvement in symptoms since relocating from [place] , but cough remains intermittently bothersome  Prior interventions and diagnostic evaluation for cough - Superior laryngeal nerve block performed without significant improvement; continuous cough persisted for  two weeks post-procedure - Steroid therapy was ineffective, also given 2/2 URI - Chest radiography and consultations with three specialists in [place]  did not identify an etiology - No abnormalities found on prior workups  History of laryngeal cancer and oral symptoms - Survivor of neck and throat cancer; scheduled for routine annual follow-up - Occasional mouth sores, previously described as a pimple inside his lip - Mouth sores have improved after changing toothpaste as recommended by family physician - No current mouth sores or oral irritation  PMH/Meds/All/SocHx/FamHx/ROS: Past Medical History:  Diagnosis Date   AAA (abdominal aortic aneurysm) without rupture    Abnormal echocardiogram    Aortic valve sclerosis    Arthritis    Atrial fibrillation (HCC)    Cancer (HCC)    skin & kidney   Cardiac murmur    Dyslipidemia    GERD (gastroesophageal reflux disease)    History of atrial fibrillation    History of kidney stones    Hypertension    Inguinal hernia    Irregular heart beat    Lipidemia    LVH (left ventricular hypertrophy)    MVP (mitral valve prolapse)    Non-rheumatic mitral regurgitation    Non-rheumatic mitral regurgitation    OSA (obstructive sleep apnea)    Palpitations    PVD (peripheral vascular disease)    Renal disorder    Right renal mass    Secondary polycythemia    Skin cancer    Stage 2 chronic kidney disease    Tremor    Past Surgical History:  Procedure Laterality Date   HERNIA REPAIR  no mesh   KNEE ARTHROSCOPY Left    x2   LUMBAR FUSION     ROBOT ASSISTED LAPAROSCOPIC NEPHRECTOMY Right 08/29/2021   Procedure: XI ROBOTIC ASSISTED LAPAROSCOPIC RADICAL NEPHRECTOMY;  Surgeon: Devere Lonni Righter, MD;  Location: [facility];  Service: Urology;  Laterality: Right;  ONLY NEEDS 180 MIN   SHOULDER ARTHROSCOPY     thumb surgery     No family history of bleeding disorders, wound healing problems or difficulty with anesthesia.  Social  Connections: Moderately Integrated (04/24/2024)   Social Connection and Isolation Panel    Frequency of Communication with Friends and Family: Three times a week    Frequency of Social Gatherings with Friends and Family: Three times a week    Attends Religious Services: More than 4 times per year    Active Member of Clubs or Organizations: No    Attends Banker Meetings: Never    Marital Status: Married    Current Outpatient Medications:    atorvastatin  (LIPITOR) 20 MG tablet, Take 20 mg by mouth at bedtime., Disp: , Rfl:    Coenzyme Q10 (CO Q 10) 100 MG CAPS, Take 200 mg by mouth daily., Disp: , Rfl:    famotidine  (PEPCID ) 40 MG tablet, Take 40 mg by mouth every evening., Disp: , Rfl:    levocetirizine (XYZAL) 5 MG tablet, Take 5 mg by mouth at bedtime., Disp: , Rfl:    losartan  (COZAAR ) 25 MG tablet, Take 25 mg by mouth daily., Disp: , Rfl:    methylPREDNISolone  (MEDROL  DOSEPAK) 4 MG TBPK tablet, Take as indicated on package, Disp: 21 each, Rfl: 0   metoprolol  succinate (TOPROL -XL) 25 MG 24 hr tablet, Take 12.5 mg by mouth in the morning and at bedtime., Disp: , Rfl:    MIEBO 1.338 GM/ML SOLN, , Disp: , Rfl:    Misc Natural Products (OSTEO BI-FLEX JOINT SHIELD PO), Take 1 tablet by mouth 2 (two) times daily., Disp: , Rfl:    Multiple Vitamins-Minerals (MULTI FOR HIM 50+) TABS, Take 1 tablet by mouth daily with breakfast., Disp: , Rfl:    Omega-3 Fat Ac-Cholecalciferol (DRY EYE OMEGA BENEFITS/VIT D-3 PO), Take 3 capsules by mouth daily., Disp: , Rfl:    omeprazole  (PRILOSEC) 20 MG capsule, Take 20 mg by mouth daily before breakfast., Disp: , Rfl:    RESTASIS  0.05 % ophthalmic emulsion, Place 1 drop into both eyes 2 (two) times daily., Disp: , Rfl:    Saw Palmetto 450 MG CAPS, Take 450 mg by mouth in the morning and at bedtime., Disp: , Rfl:    SYSTANE NIGHT 0.3 % GEL ophthalmic ointment, 1 Application at bedtime., Disp: , Rfl:    TYLENOL  325 MG tablet, Take 325-650 mg by mouth  every 6 (six) hours as needed for mild pain (pain score 1-3) or headache. (Patient not taking: Reported on 09/11/2024), Disp: , Rfl:  A complete ROS was performed with pertinent positives/negatives noted in the HPI. The remainder of the ROS are negative.   Physical Exam:  BP 134/85 (BP Location: Left Arm, Patient Position: Sitting, Cuff Size: Normal)   Pulse 61   Temp 98 F (36.7 C)   Wt 183 lb (83 kg)   SpO2 95%   BMI 27.02 kg/m  General: Well developed, well nourished. No acute distress. Head/Face: Normocephalic. No sinus tenderness. Facial nerve intact and equal bilaterally. No facial lacerations. Eyes: PERRL, no scleral icterus or conjunctival hemorrhage. EOMI. Ears: No gross deformity. Normal external canal. Tympanic membrane in  tact bilaterally Hearing: Normal speech reception.  Nose: No gross deformity or lesions. No purulent discharge. No turbinate hypertrophy. Mouth/Oropharynx: Lips without any lesions. No mucosal lesions within the oropharynx. No tonsillar enlargement, exudate, or lesions. Pharyngeal walls symmetrical. Uvula midline. Tongue midline without lesions. Larynx: See TFL if applicable Nasopharynx: See TFL if applicable Neck: Trachea midline. No masses. No thyromegaly or nodules palpated. No crepitus. Lymphatic: No lymphadenopathy in the neck. Respiratory: No stridor or distress. Room air. Cardiovascular: Regular rate and rhythm. Extremities: No edema or cyanosis. Warm and well-perfused. Skin: No scars or lesions on face or neck. Neurologic: CN II-XII grossly intact. Moving all extremities without gross abnormality. Other:  Independent Review of Additional Tests or Records: Reviewed Dr. 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 The risks and benefits of the surgical procedure have been explained in detail to the patient and they have elected to proceed.  CONSENT:  Informed consent was obtained prior to the procedure after discussion of risks, benefits, and alternatives and expected outcomes were discussed with the patient; consent placed in chart. The possibilities of reaction to medication, increase in pain, bleeding, infection, prolonged numbness or dysphagia, inadequate treatment response, continued or increased pain, the need for additional procedures, failure to diagnose a condition, and creating a complication requiring transfusion or operation or additional intervention were discussed with the patient. The patient concurred with the proposed plan, giving informed consent.    UNIVERSAL PROTOCOL/ TIMEOUT: Preprocedure verification is complete- patient verified and consents confirmed.  H&P REVIEW: The patient's history and physical were reviewed today prior to procedure. All medications were reviewed and updated as well.  ANESTHESIA: lidocaine  1% without epi  PROCEDURE DETAILS: The patient was brought to the clinic and placed in a seated position.  The thyroid cartilage and hyoid bone were palpated and the thyrohyoid membrane was identified. 1ml of kenalog  40mg /ml and 1ml of local anesthetic were mixed and injected at the site of the bilateral superior laryngeal nerve entry through the thyrohyoid membrane, taking great care to avoid intra-arterial injection. This concluded the procedure. There were no complications and the patient tolerated the procedure well.  ESTIMATED BLOOD LOSS: Minimal  FINDINGS:  No evidence of significant bleeding or other complication  CONDITION:  Stable  COMPLICATIONS: no significant complications  DISPOSITION: The patient tolerated the procedure well without apparent complications and was ambulatory.  Impression & Plans: Assessment & Plan Acute cough with rib pain due to likely viral upper respiratory infection Hx of chronic cough - unresponsive to first SLN block - SLN Block bilaterally - hx of second hand smoke  Chronic refractory cough Chronic cough for two years with no identified etiology. Previous treatments, including corticosteroids and nerve block, were ineffective. 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 Superior laryngeal nerve block may provide relief, though outcomes are variable. - Administered superior laryngeal nerve block with corticosteroid and lidocaine . - Recommended follow-up in 1-2 months to assess response and symptoms.  Penne Croak 10/21/2024 1:22 PM Available on Haiku chat and [employer]  Department of Otolaryngology Contact Info: Otolaryngology [employer] including questions about appointments or questions: [phone] - If after normal business hours (Monday-Friday after 5PM or  Weekends/Holidays), please call same number and follow prompts for Patient Access Line. There is a physician on call for urgent matters. For life threatening emergencies, please call 911

## 2025-02-12 ENCOUNTER — Ambulatory Visit: Admitting: Neurology
